# Patient Record
Sex: Female | Born: 1995 | Race: White | Hispanic: No | Marital: Single | Smoking: Never smoker
Health system: Southern US, Community
[De-identification: ages and names within clinical notes are randomized; demographics above are authoritative.]

## PROBLEM LIST (undated history)

## (undated) DIAGNOSIS — R011 Cardiac murmur, unspecified: Secondary | ICD-10-CM

## (undated) HISTORY — PX: OTHER SURGICAL HISTORY: SHX169

---
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
## 2019-12-31 ENCOUNTER — Encounter: Payer: Self-pay | Admitting: Emergency Medicine

## 2019-12-31 ENCOUNTER — Other Ambulatory Visit: Payer: Self-pay

## 2019-12-31 ENCOUNTER — Ambulatory Visit
Admission: EM | Admit: 2019-12-31 | Discharge: 2019-12-31 | Disposition: A | Discharge status: Home / Self Care | Payer: BC Managed Care – PPO | Attending: Emergency Medicine | Admitting: Emergency Medicine

## 2019-12-31 DIAGNOSIS — Z20822 Contact with and (suspected) exposure to covid-19: Secondary | ICD-10-CM | POA: Diagnosis present

## 2019-12-31 DIAGNOSIS — J069 Acute upper respiratory infection, unspecified: Secondary | ICD-10-CM | POA: Diagnosis not present

## 2019-12-31 DIAGNOSIS — J029 Acute pharyngitis, unspecified: Secondary | ICD-10-CM | POA: Diagnosis present

## 2019-12-31 LAB — POCT RAPID STREP A (OFFICE): Rapid Strep A Screen: NEGATIVE

## 2019-12-31 NOTE — Discharge Instructions (Addendum)
COVID testing ordered.  It will take between 2-5 days for test results.  Someone will contact you regarding abnormal results.    In the meantime: You should remain isolated in your home for 10 days from symptom onset AND greater than 72 hours after symptoms resolution (absence of fever without the use of fever-reducing medication and improvement in respiratory symptoms), whichever is longer Get plenty of rest and push fluids Use OTC zyrtec for nasal congestion, runny nose, and/or sore throat Use OTC flonase for nasal congestion and runny nose Use medications daily for symptom relief Use OTC medications like ibuprofen or tylenol as needed fever or pain Call or go to the ED if you have any new or worsening symptoms such as fever, cough, shortness of breath, chest tightness, chest pain, turning blue, changes in mental status, etc...  

## 2019-12-31 NOTE — ED Triage Notes (Signed)
Saturday, patient noticed her throat was really sore with swallowing.  Patient has stuffy head, but was around cats yesterday (usually causes head stuffiness)  Patient received a message from a parent that a child in class has strep throat, this is her concern

## 2019-12-31 NOTE — ED Provider Notes (Signed)
[facility]   [id number] 12/31/19 Arrival Time: 0806   CC: COVID symptoms  SUBJECTIVE: History from: patient.  Elizabeth Nguyen is a 24 y.o. female who presents with congestion, slight runny nose, and sore throat x 2 days.  Admits to positive strep exposure.  Denies recent travel.  Has tried OTC nyquil with minimal relief.  Symptoms are made worse with swallowing, but tolerating liquids and secretions.  Reports previous symptoms in the past with strep.   Denies fever, chills, fatigue, cough, SOB, wheezing, chest pain, nausea, vomiting, changes in bowel or bladder habits.    ROS: As per HPI.  All other pertinent ROS negative.     Past Medical History:  Diagnosis Date  . Asthma   . Rosanna Randy syndrome    Past Surgical History:  Procedure Laterality Date  . Lt femor    . Repaired ligaments rt wrist     Allergies  Allergen Reactions  . Amoxicillin Hives   No current facility-administered medications on file prior to encounter.   Current Outpatient Medications on File Prior to Encounter  Medication Sig Dispense Refill  . albuterol (PROVENTIL HFA;VENTOLIN HFA) 108 (90 BASE) MCG/ACT inhaler Inhale 2 puffs into the lungs every 6 (six) hours as needed for wheezing or shortness of breath.     Social History   Socioeconomic History  . Marital status: Single    Spouse name: Not on file  . Number of children: Not on file  . Years of education: Not on file  . Highest education level: Not on file  Occupational History  . Not on file  Tobacco Use  . Smoking status: Never Smoker  Substance and Sexual Activity  . Alcohol use: No  . Drug use: No  . Sexual activity: Never    Birth control/protection: Abstinence  Other Topics Concern  . Not on file  Social History Narrative  . Not on file   Social Determinants of Health   Financial Resource Strain:   . Difficulty of Paying Living Expenses:   Food Insecurity:   . Worried About Charity fundraiser in the Last Year:   .  Arboriculturist in the Last Year:   Transportation Needs:   . Film/video editor (Medical):   Marland Kitchen Lack of Transportation (Non-Medical):   Physical Activity:   . Days of Exercise per Week:   . Minutes of Exercise per Session:   Stress:   . Feeling of Stress :   Social Connections:   . Frequency of Communication with Friends and Family:   . Frequency of Social Gatherings with Friends and Family:   . Attends Religious Services:   . Active Member of Clubs or Organizations:   . Attends Archivist Meetings:   Marland Kitchen Marital Status:   Intimate Partner Violence:   . Fear of Current or Ex-Partner:   . Emotionally Abused:   Marland Kitchen Physically Abused:   . Sexually Abused:    Family History  Problem Relation Age of Onset  . Asthma Father     OBJECTIVE:  Vitals:   12/31/19 0815  BP: 112/74  Pulse: 87  Resp: 16  Temp: 98.1 F (36.7 C)  TempSrc: Oral  SpO2: 98%     General appearance: alert; appears mildly fatigued, but nontoxic; speaking in full sentences and tolerating own secretions HEENT: NCAT; Ears: EACs clear, TMs pearly gray; Eyes: PERRL.  EOM grossly intact.  Nose: nares patent without rhinorrhea, Throat: oropharynx clear, tonsils non erythematous or enlarged,  uvula midline  Neck: supple without LAD Lungs: unlabored respirations, symmetrical air entry; cough: absent; no respiratory distress; CTAB Heart: regular rate and rhythm.   Skin: warm and dry Psychological: alert and cooperative; normal mood and affect  LABS:  Results for orders placed or performed during the hospital encounter of 12/31/19 (from the past 24 hour(s))  POCT rapid strep A     Status: None   Collection Time: 12/31/19  8:38 AM  Result Value Ref Range   Rapid Strep A Screen Negative Negative    ASSESSMENT & PLAN:  1. Viral URI   2. Sore throat   3. Suspected COVID-19 virus infection    Strep negative.  Culture sent, we will call you with abnormal results COVID testing ordered.  It will take  between 2-5 days for test results.  Someone will contact you regarding abnormal results.    In the meantime: You should remain isolated in your home for 10 days from symptom onset AND greater than 72 hours after symptoms resolution (absence of fever without the use of fever-reducing medication and improvement in respiratory symptoms), whichever is longer Get plenty of rest and push fluids Use OTC zyrtec for nasal congestion, runny nose, and/or sore throat Use OTC flonase for nasal congestion and runny nose Use medications daily for symptom relief Use OTC medications like ibuprofen or tylenol as needed fever or pain Call or go to the ED if you have any new or worsening symptoms such as fever, cough, shortness of breath, chest tightness, chest pain, turning blue, changes in mental status, etc...   Reviewed expectations re: course of current medical issues. Questions answered. Outlined signs and symptoms indicating need for more acute intervention. Patient verbalized understanding. After Visit Summary given.         Alvino [facility] [place], PA-C 12/31/19 [phone]

## 2020-01-01 LAB — NOVEL CORONAVIRUS, NAA: SARS-CoV-2, NAA: NOT DETECTED

## 2020-01-01 LAB — SARS-COV-2, NAA 2 DAY TAT

## 2020-01-03 LAB — CULTURE, GROUP A STREP (THRC)

## 2020-06-02 ENCOUNTER — Other Ambulatory Visit: Payer: Self-pay

## 2020-06-02 ENCOUNTER — Ambulatory Visit
Admission: EM | Admit: 2020-06-02 | Discharge: 2020-06-02 | Disposition: A | Discharge status: Home / Self Care | Payer: BC Managed Care – PPO | Attending: Emergency Medicine | Admitting: Emergency Medicine

## 2020-06-02 DIAGNOSIS — J069 Acute upper respiratory infection, unspecified: Secondary | ICD-10-CM | POA: Diagnosis not present

## 2020-06-02 DIAGNOSIS — Z20822 Contact with and (suspected) exposure to covid-19: Secondary | ICD-10-CM

## 2020-06-02 DIAGNOSIS — Z1152 Encounter for screening for COVID-19: Secondary | ICD-10-CM

## 2020-06-02 MED ORDER — BENZONATATE 100 MG PO CAPS: 100.0000 mg | ORAL_CAPSULE | Freq: Three times a day (TID) | ORAL | 0 refills | Status: DC

## 2020-06-02 NOTE — ED Triage Notes (Signed)
Pt presents with nasal congestion and cough that began on Saturday  °

## 2020-06-02 NOTE — ED Provider Notes (Signed)
[facility] CARE CENTER   [id number] 06/02/20 Arrival Time: 0806   CC: COVID symptoms  SUBJECTIVE: History from: patient.  Elizabeth Nguyen is a 24 y.o. female who presents with nasal congestion and cough x 2 days.  Denies known sick exposure to COVID, flu or strep.  Works as Engineer, site.  Has tried OTC medications without relief.  Symptoms are made worse with talking.  Reports previous symptoms in the past with a cold.  Denies previous COVID infection.  Did not received covid vaccine.   Denies fever, chills, sore throat, SOB, wheezing, chest pain, nausea, changes in bowel or bladder habits.     ROS: As per HPI.  All other pertinent ROS negative.     Past Medical History:  Diagnosis Date   Asthma    Gilbert syndrome    Past Surgical History:  Procedure Laterality Date   Lt femor     Repaired ligaments rt wrist     Allergies  Allergen Reactions   Amoxicillin Hives   No current facility-administered medications on file prior to encounter.   Current Outpatient Medications on File Prior to Encounter  Medication Sig Dispense Refill   albuterol (PROVENTIL HFA;VENTOLIN HFA) 108 (90 BASE) MCG/ACT inhaler Inhale 2 puffs into the lungs every 6 (six) hours as needed for wheezing or shortness of breath.     Social History   Socioeconomic History   Marital status: Single    Spouse name: Not on file   Number of children: Not on file   Years of education: Not on file   Highest education level: Not on file  Occupational History   Not on file  Tobacco Use   Smoking status: Never Smoker  Substance and Sexual Activity   Alcohol use: No   Drug use: No   Sexual activity: Never    Birth control/protection: Abstinence  Other Topics Concern   Not on file  Social History Narrative   Not on file   Social Determinants of Health   Financial Resource Strain:    Difficulty of Paying Living Expenses: Not on file  Food Insecurity:    Worried About Running Out of Food  in the Last Year: Not on file   Ran Out of Food in the Last Year: Not on file  Transportation Needs:    Lack of Transportation (Medical): Not on file   Lack of Transportation (Non-Medical): Not on file  Physical Activity:    Days of Exercise per Week: Not on file   Minutes of Exercise per Session: Not on file  Stress:    Feeling of Stress : Not on file  Social Connections:    Frequency of Communication with Friends and Family: Not on file   Frequency of Social Gatherings with Friends and Family: Not on file   Attends Religious Services: Not on file   Active Member of Clubs or Organizations: Not on file   Attends Banker Meetings: Not on file   Marital Status: Not on file  Intimate Partner Violence:    Fear of Current or Ex-Partner: Not on file   Emotionally Abused: Not on file   Physically Abused: Not on file   Sexually Abused: Not on file   Family History  Problem Relation Age of Onset   Asthma Father     OBJECTIVE:  Vitals:   06/02/20 0816  BP: 112/70  Pulse: 78  Resp: 20  Temp: 98.3 F (36.8 C)  SpO2: 98%     General appearance: alert;  well-appearing, nontoxic; speaking in full sentences and tolerating own secretions HEENT: NCAT; Ears: EACs clear, TMs pearly gray; Eyes: PERRL.  EOM grossly intact.Nose: nares patent without rhinorrhea, Throat: oropharynx clear, tonsils non erythematous or enlarged, uvula midline  Neck: supple without LAD Lungs: unlabored respirations, symmetrical air entry; cough: absent; no respiratory distress; CTAB Heart: regular rate and rhythm.  Skin: warm and dry Psychological: alert and cooperative; normal mood and affect   ASSESSMENT & PLAN:  1. Encounter for screening for COVID-19   2. Viral URI with cough   3. Suspected COVID-19 virus infection     Meds ordered this encounter  Medications   benzonatate (TESSALON) 100 MG capsule    Sig: Take 1 capsule (100 mg total) by mouth every 8 (eight) hours.     Dispense:  21 capsule    Refill:  0    Order Specific Question:   Supervising Provider    Answer:   Eustace Moore [[id number]]   COVID testing ordered.  It will take between 5-7 days for test results.  Someone will contact you regarding abnormal results.    In the meantime: You should remain isolated in your home for 10 days from symptom onset AND greater than 72 hours after symptoms resolution (absence of fever without the use of fever-reducing medication and improvement in respiratory symptoms), whichever is longer Get plenty of rest and push fluids Tessalon Perles prescribed for cough Use OTC zyrtec for nasal congestion, runny nose, and/or sore throat Use OTC flonase for nasal congestion and runny nose Use medications daily for symptom relief Use OTC medications like ibuprofen or tylenol as needed fever or pain Call or go to the ED if you have any new or worsening symptoms such as fever, worsening cough, shortness of breath, chest tightness, chest pain, turning blue, changes in mental status, etc...   Reviewed expectations re: course of current medical issues. Questions answered. Outlined signs and symptoms indicating need for more acute intervention. Patient verbalized understanding. After Visit Summary given.         Rennis Harding, PA-C 06/02/20 [phone]

## 2020-06-02 NOTE — Discharge Instructions (Signed)

## 2020-06-03 LAB — SARS-COV-2, NAA 2 DAY TAT

## 2020-06-03 LAB — NOVEL CORONAVIRUS, NAA: SARS-CoV-2, NAA: NOT DETECTED

## 2020-10-01 ENCOUNTER — Institutional Professional Consult (permissible substitution): Payer: BC Managed Care – PPO | Admitting: Plastic Surgery

## 2020-10-19 ENCOUNTER — Encounter: Payer: Self-pay | Admitting: Emergency Medicine

## 2020-10-19 ENCOUNTER — Ambulatory Visit
Admission: EM | Admit: 2020-10-19 | Discharge: 2020-10-19 | Disposition: A | Discharge status: Home / Self Care | Payer: BC Managed Care – PPO | Attending: Emergency Medicine | Admitting: Emergency Medicine

## 2020-10-19 ENCOUNTER — Ambulatory Visit (INDEPENDENT_AMBULATORY_CARE_PROVIDER_SITE_OTHER): Payer: BC Managed Care – PPO

## 2020-10-19 ENCOUNTER — Other Ambulatory Visit: Payer: Self-pay

## 2020-10-19 DIAGNOSIS — M940 Chondrocostal junction syndrome [Tietze]: Secondary | ICD-10-CM

## 2020-10-19 DIAGNOSIS — J069 Acute upper respiratory infection, unspecified: Secondary | ICD-10-CM | POA: Diagnosis not present

## 2020-10-19 DIAGNOSIS — R0981 Nasal congestion: Secondary | ICD-10-CM

## 2020-10-19 DIAGNOSIS — R0789 Other chest pain: Secondary | ICD-10-CM | POA: Diagnosis not present

## 2020-10-19 MED ORDER — BENZONATATE 100 MG PO CAPS: 100.0000 mg | ORAL_CAPSULE | Freq: Three times a day (TID) | ORAL | 0 refills | Status: DC | PRN

## 2020-10-19 NOTE — ED Provider Notes (Signed)
[facility] CARE CENTER   [id number] 10/19/20 Arrival Time: 1779   CC: URI  SUBJECTIVE: History from: patient.  Elizabeth Nguyen is a 25 y.o. female who presented to the urgent care with a complaint of sinus congestion, chest tightness and pain with respiration for the past 1 week.  Was seen by PCP and was prescribed prednisone with no relief.  Denies sick exposure to COVID, flu or strep.  Denies recent travel.   Denies alleviating or aggravating factors.  Denies previous symptoms in the past.   Denies fever, chills, fatigue, sinus pain, rhinorrhea, sore throat, SOB, wheezing, nausea, changes in bowel or bladder habits.    ROS: As per HPI.  All other pertinent ROS negative.      Past Medical History:  Diagnosis Date  . Asthma   . Sullivan Lone syndrome    Past Surgical History:  Procedure Laterality Date  . Lt femor    . Repaired ligaments rt wrist     Allergies  Allergen Reactions  . Amoxicillin Hives   No current facility-administered medications on file prior to encounter.   Current Outpatient Medications on File Prior to Encounter  Medication Sig Dispense Refill  . albuterol (PROVENTIL HFA;VENTOLIN HFA) 108 (90 BASE) MCG/ACT inhaler Inhale 2 puffs into the lungs every 6 (six) hours as needed for wheezing or shortness of breath.     Social History   Socioeconomic History  . Marital status: Single    Spouse name: Not on file  . Number of children: Not on file  . Years of education: Not on file  . Highest education level: Not on file  Occupational History  . Not on file  Tobacco Use  . Smoking status: Never Smoker  . Smokeless tobacco: Never Used  Substance and Sexual Activity  . Alcohol use: No  . Drug use: No  . Sexual activity: Never    Birth control/protection: Abstinence  Other Topics Concern  . Not on file  Social History Narrative  . Not on file   Social Determinants of Health   Financial Resource Strain: Not on file  Food Insecurity: Not on file   Transportation Needs: Not on file  Physical Activity: Not on file  Stress: Not on file  Social Connections: Not on file  Intimate Partner Violence: Not on file   Family History  Problem Relation Age of Onset  . Asthma Father     OBJECTIVE:  Vitals:   10/19/20 0824  BP: 109/66  Pulse: 95  Resp: 16  Temp: 98.5 F (36.9 C)  TempSrc: Oral  SpO2: 98%     General appearance: alert; appears fatigued, but nontoxic; speaking in full sentences and tolerating own secretions HEENT: NCAT; Ears: EACs clear, TMs pearly gray; Eyes: PERRL.  EOM grossly intact. Sinuses: nontender; Nose: nares patent without rhinorrhea, Throat: oropharynx clear, tonsils non erythematous or enlarged, uvula midline  Neck: supple without LAD Lungs: unlabored respirations, symmetrical air entry; cough: mild; no respiratory distress; CTAB Heart: regular rate and rhythm.  Radial pulses 2+ symmetrical bilaterally Skin: warm and dry Psychological: alert and cooperative; normal mood and affect  LABS:  No results found for this or any previous visit (from the past 24 hour(s)).   RADIOLOGY  DG Chest 2 View  Result Date: 10/19/2020 CLINICAL DATA:  Chest pain and tightness with breathing. Sinus congestion. EXAM: CHEST - 2 VIEW COMPARISON:  Chest radiograph March 21, 2013 FINDINGS: The heart size and mediastinal contours are within normal limits. No focal consolidation. No pleural  effusion. No pneumothorax. Two small dense sclerotic lesions in the right humeral head, likely benign bone islands. IMPRESSION: No acute cardiopulmonary disease. Electronically Signed   By: Maudry Mayhew MD   On: 10/19/2020 08:43     Chest X-ray is negative for cardiopulmonary abnormality.  I have reviewed the x-ray myself and the radiologist interpretation.  I am in agreement with the radiologist interpretation.   ASSESSMENT & PLAN:  1. Costochondritis   2. Viral URI with cough     Meds ordered this encounter  Medications  .  benzonatate (TESSALON) 100 MG capsule    Sig: Take 1 capsule (100 mg total) by mouth 3 (three) times daily as needed for cough.    Dispense:  30 capsule    Refill:  0   Patient is stable at discharge.  Symptom is likely from costochondritis from coughing.  Will prescribed Tessalon Perles to help suppress her cough.  To continue to use OTC Tylenol as needed for pain  Discharge instructions  Get plenty of rest and push fluids Tessalon Perles prescribed for cough Continue to take prednisone as prescribed and directed Use OTC medications like ibuprofen or tylenol as needed fever or pain Follow-up with PCP Call or go to the ED if you have any new or worsening symptoms such as fever, worsening cough, shortness of breath, chest tightness, chest pain, turning blue, changes in mental status, etc...   Reviewed expectations re: course of current medical issues. Questions answered. Outlined signs and symptoms indicating need for more acute intervention. Patient verbalized understanding. After Visit Summary given.         Durward Parcel, FNP 10/19/20 [phone]

## 2020-10-19 NOTE — ED Triage Notes (Signed)
Pt reports pain in chest with breathing and tightness and sinus congestion.   Seen at Doctors office on Tuesday and put on prednisone.  S/s started x 1 week ago.

## 2020-10-19 NOTE — Discharge Instructions (Addendum)
Get plenty of rest and push fluids Tessalon Perles prescribed for cough Continue to take prednisone as prescribed and directed Use OTC medications like ibuprofen or tylenol as needed fever or pain Follow-up with PCP Call or go to the ED if you have any new or worsening symptoms such as fever, worsening cough, shortness of breath, chest tightness, chest pain, turning blue, changes in mental status, etc..Marland Kitchen

## 2020-10-29 ENCOUNTER — Ambulatory Visit (INDEPENDENT_AMBULATORY_CARE_PROVIDER_SITE_OTHER): Payer: BC Managed Care – PPO | Admitting: Plastic Surgery

## 2020-10-29 ENCOUNTER — Encounter: Payer: Self-pay | Admitting: Plastic Surgery

## 2020-10-29 ENCOUNTER — Other Ambulatory Visit: Payer: Self-pay

## 2020-10-29 VITALS — BP 98/58 | HR 78 | Ht 65.0 in | Wt 152.4 lb

## 2020-10-29 DIAGNOSIS — L989 Disorder of the skin and subcutaneous tissue, unspecified: Secondary | ICD-10-CM

## 2020-10-29 NOTE — Progress Notes (Signed)
   Referring Provider Practice, [facility] [address] [place],  [place] [address]   CC:  Chief Complaint  Patient presents with  . Advice Only      Elizabeth Nguyen is an 25 y.o. female.  HPI: Patient presents to discuss a symptomatic skin lesion in her right alar crease.  Is been present for a number of years and she believes it is growing in size.  It catches on things intermittently and she is interested in having it removed.  Allergies  Allergen Reactions  . Amoxicillin Hives    Outpatient Encounter Medications as of 10/29/2020  Medication Sig  . albuterol (PROVENTIL HFA;VENTOLIN HFA) 108 (90 BASE) MCG/ACT inhaler Inhale 2 puffs into the lungs every 6 (six) hours as needed for wheezing or shortness of breath.  . benzonatate (TESSALON) 100 MG capsule Take 1 capsule (100 mg total) by mouth 3 (three) times daily as needed for cough.   No facility-administered encounter medications on file as of 10/29/2020.     Past Medical History:  Diagnosis Date  . Asthma   . Sullivan Lone syndrome     Past Surgical History:  Procedure Laterality Date  . Lt femor    . Repaired ligaments rt wrist      Family History  Problem Relation Age of Onset  . Asthma Father     Social History   Social History Narrative  . Not on file     Review of Systems General: Denies fevers, chills, weight loss CV: Denies chest pain, shortness of breath, palpitations  Physical Exam Vitals with BMI 10/29/2020 10/19/2020 06/02/2020  Height 5\' 5"  - -  Weight 152 lbs 6 oz - -  BMI 25.36 - -  Systolic 98 109 112  Diastolic 58 66 70  Pulse 78 95 78    General:  No acute distress,  Alert and oriented, Non-Toxic, Normal speech and affect Examination shows a 3 mm flesh colored papule on the right alar crease.  There is no surrounding skin changes and the lesion itself is fairly well-defined.  Assessment/Plan Patient presents with a symptomatic skin lesion in the right alar crease.  We discussed excision under  local.  We discussed the risk that include bleeding, infection, damage to surrounding structures and need for additional procedures.  All of her questions were answered we will plan to move forward with excision.  I did discuss the location and orientation of the scar and feel that it will heal nicely in the alar crease.  10/29/2020, 3:29 PM

## 2020-12-04 ENCOUNTER — Ambulatory Visit (INDEPENDENT_AMBULATORY_CARE_PROVIDER_SITE_OTHER): Payer: BC Managed Care – PPO | Admitting: Plastic Surgery

## 2020-12-04 ENCOUNTER — Encounter: Payer: Self-pay | Admitting: Plastic Surgery

## 2020-12-04 ENCOUNTER — Other Ambulatory Visit (HOSPITAL_COMMUNITY)
Admission: RE | Admit: 2020-12-04 | Discharge: 2020-12-04 | Disposition: A | Discharge status: Home / Self Care | Payer: BC Managed Care – PPO | Source: Ambulatory Visit | Attending: Plastic Surgery | Admitting: Plastic Surgery

## 2020-12-04 ENCOUNTER — Other Ambulatory Visit: Payer: Self-pay

## 2020-12-04 VITALS — BP 133/81 | HR 91

## 2020-12-04 DIAGNOSIS — L989 Disorder of the skin and subcutaneous tissue, unspecified: Secondary | ICD-10-CM

## 2020-12-04 NOTE — Progress Notes (Signed)
Operative Note   DATE OF OPERATION: 12/04/2020  LOCATION:    SURGICAL DEPARTMENT: Plastic Surgery  PREOPERATIVE DIAGNOSES: Right nasal lesion  POSTOPERATIVE DIAGNOSES:  same  PROCEDURE:  1. Excision of right nasal lesion measuring 1.5 cm 2. Complex closure measuring 1.5 cm  SURGEON: Ancil Linsey, MD  ANESTHESIA:  Local  COMPLICATIONS: None.   INDICATIONS FOR PROCEDURE:  The patient, Elizabeth Nguyen is a 25 y.o. female born on 13-Sep-1995, is here for treatment of right nasal lesion MRN: [id number]  CONSENT:  Informed consent was obtained directly from the patient. Risks, benefits and alternatives were fully discussed. Specific risks including but not limited to bleeding, infection, hematoma, seroma, scarring, pain, infection, wound healing problems, and need for further surgery were all discussed. The patient did have an ample opportunity to have questions answered to satisfaction.   DESCRIPTION OF PROCEDURE:  Local anesthesia was administered. The patient's operative site was prepped and draped in a sterile fashion. A time out was performed and all information was confirmed to be correct.  The lesion was excised with a 15 blade.  Hemostasis was obtained.  Circumferential undermining was performed and the skin was advanced and closed in layers with interrupted buried Monocryl sutures and 5-0 fast gut for the skin.  The lesion excised measured 1.5 cm, and the total length of closure measured 1.5 cm.    The patient tolerated the procedure well.  There were no complications.

## 2020-12-08 LAB — SURGICAL PATHOLOGY

## 2020-12-09 ENCOUNTER — Telehealth: Payer: Self-pay

## 2020-12-09 NOTE — Telephone Encounter (Signed)
Patient called to say that she saw [place] last Thursday for a mole removal and on Saturday she noticed that she was swelling under her eyes.  Patient said that on Sunday, her stitches started coming out and she is concerned because we had told her that they would come out roughly in 7-10 days.  Patient said that her swelling has gone down significantly.  Please call.

## 2020-12-10 NOTE — Telephone Encounter (Signed)
Called and spoke with the patient on (12/09/20) regarding the message below.  Informed the patient that I spoke with [facility] and he stated that the swelling is normal, and with the sutures some people absorb the sutures quicker than others.  Patient verbalized understanding and agreed.  Patient also asked if she needed to put anything on the area to protect it.  Informed her per [facility] do not use sunscreen on it because it's to soon.  But she can use vaseline on it.  Patient agreed.  Also informed the patient of recent surgical pathology results.  Per Matthew,PA-C the results shows it was just a mole.  Patient verbalized understanding and agreed.//AB/CMA

## 2020-12-12 ENCOUNTER — Telehealth: Payer: Self-pay

## 2020-12-12 NOTE — Telephone Encounter (Signed)
Patient called to say that she came in last Thursday for a mole removal and her stitches started coming out on Sunday from the deepest part of the incision.  Patient said that it scabbed over.  She said that the scab came off yesterday and it doesn't look like the two pieces of skin are together.  Patient said that it is not bleeding but she is wondering if she needs to be seen today.  Please call.

## 2020-12-12 NOTE — Telephone Encounter (Signed)
Returned patients call. She indicated it appeared some of the stitches in the deepest part of incision came out to early. It is currently not bleeding. Under the advise from [facility], apply Vaseline and cover with gauze. I will have the front staff call her back to come back in Monday.

## 2020-12-15 ENCOUNTER — Encounter: Payer: Self-pay | Admitting: Plastic Surgery

## 2020-12-15 ENCOUNTER — Other Ambulatory Visit: Payer: Self-pay

## 2020-12-15 ENCOUNTER — Ambulatory Visit (INDEPENDENT_AMBULATORY_CARE_PROVIDER_SITE_OTHER): Payer: BC Managed Care – PPO | Admitting: Plastic Surgery

## 2020-12-15 VITALS — BP 90/63 | HR 74

## 2020-12-15 DIAGNOSIS — L989 Disorder of the skin and subcutaneous tissue, unspecified: Secondary | ICD-10-CM

## 2020-12-15 NOTE — Progress Notes (Signed)
Patient presents postop from excision of an intradermal nevus in her right alar crease.  She had concerns about some of the stitches falling out having some swelling under her right eye over the weekend that has since subsided for the most part.  On exam the incision the incision looks to be healing fine with minimal surrounding erythema.  There may be some very subtle swelling of her right cheek but not anything significant.  I think overall she is healing well for being just over a week out from this procedure.  We discussed management of the scar and sun protection and we will plan to see her again on an as-needed basis.  All of her questions were answered.

## 2021-01-16 ENCOUNTER — Ambulatory Visit
Admission: EM | Admit: 2021-01-16 | Discharge: 2021-01-16 | Disposition: A | Discharge status: Home / Self Care | Payer: BC Managed Care – PPO | Attending: Emergency Medicine | Admitting: Emergency Medicine

## 2021-01-16 ENCOUNTER — Other Ambulatory Visit: Payer: Self-pay

## 2021-01-16 ENCOUNTER — Encounter: Payer: Self-pay | Admitting: Emergency Medicine

## 2021-01-16 DIAGNOSIS — H8110 Benign paroxysmal vertigo, unspecified ear: Secondary | ICD-10-CM

## 2021-01-16 DIAGNOSIS — R0789 Other chest pain: Secondary | ICD-10-CM

## 2021-01-16 DIAGNOSIS — R42 Dizziness and giddiness: Secondary | ICD-10-CM | POA: Diagnosis not present

## 2021-01-16 MED ORDER — HYDROXYZINE HCL 25 MG PO TABS: 25.0000 mg | ORAL_TABLET | Freq: Four times a day (QID) | ORAL | 0 refills | Status: DC

## 2021-01-16 MED ORDER — MECLIZINE HCL 50 MG PO TABS: 50.0000 mg | ORAL_TABLET | Freq: Three times a day (TID) | ORAL | 0 refills | Status: AC | PRN

## 2021-01-16 NOTE — ED Provider Notes (Addendum)
[facility] CARE CENTER   [id number] 01/16/21 Arrival Time: 1715   CC: CHEST tightness/ dizziness  SUBJECTIVE:  Elizabeth Nguyen is a 25 y.o. female who presents with complaint of chest tightness (feels like something needs to pop) and intermittent dizziness (like room is tilted/ feeling off balanced) x 4-5 days.  Reports having panic attacks more frequently.  Localizes chest pain to the center of chest.  Describes as stable, that is intermittent (with episodes last 30 minutes).  Denies pain at this time.   Has NOT tried OTC medications without relief.  Symptoms made worse with sudden movement.  Denies radiates symptoms.  Denies previous symptoms in the past.  Denies fever, chills, palpitations, tachycardia, SOB, nausea, vomiting, abdominal pain, changes in bowel or bladder habits, diaphoresis, numbness/tingling in extremities, peripheral edema, or anxiety.    Denies SOB, calf pain or swelling, recent long travel, recent surgery, pregnancy, malignancy, tobacco use, hormone use, or previous blood clot  Denies close relatives with cardiac hx  ROS: As per HPI.  All other pertinent ROS negative.    Past Medical History:  Diagnosis Date  . Asthma   . Sullivan Lone syndrome    Past Surgical History:  Procedure Laterality Date  . Lt femor    . Repaired ligaments rt wrist     Allergies  Allergen Reactions  . Amoxicillin Hives   No current facility-administered medications on file prior to encounter.   Current Outpatient Medications on File Prior to Encounter  Medication Sig Dispense Refill  . [DISCONTINUED] albuterol (PROVENTIL HFA;VENTOLIN HFA) 108 (90 BASE) MCG/ACT inhaler Inhale 2 puffs into the lungs every 6 (six) hours as needed for wheezing or shortness of breath. (Patient not taking: No sig reported)     Social History   Socioeconomic History  . Marital status: Single    Spouse name: Not on file  . Number of children: Not on file  . Years of education: Not on file  . Highest  education level: Not on file  Occupational History  . Not on file  Tobacco Use  . Smoking status: Never Smoker  . Smokeless tobacco: Never Used  Substance and Sexual Activity  . Alcohol use: No  . Drug use: No  . Sexual activity: Never    Birth control/protection: Abstinence  Other Topics Concern  . Not on file  Social History Narrative  . Not on file   Social Determinants of Health   Financial Resource Strain: Not on file  Food Insecurity: Not on file  Transportation Needs: Not on file  Physical Activity: Not on file  Stress: Not on file  Social Connections: Not on file  Intimate Partner Violence: Not on file   Family History  Problem Relation Age of Onset  . Asthma Father      OBJECTIVE:  Vitals:   01/16/21 1806  BP: 114/77  Pulse: 66  Resp: 17  Temp: 97.9 F (36.6 C)  TempSrc: Oral  SpO2: 99%    General appearance: alert; no distress Eyes: PERRLA; EOMI; conjunctiva normal HENT: normocephalic; atraumatic Neck: supple Lungs: clear to auscultation bilaterally without adventitious breath sounds Heart: regular rate and rhythm.  Clear S1 and S2 without rubs, gallops, or murmur. Abdomen: soft, non-tender; bowel sounds normal; no guarding Extremities: no cyanosis or edema; symmetrical with no gross deformities Skin: warm and dry Neuro: CN 2-12 grossly intact; finger to nose without deficit; negative pronator drift Psychological: alert and cooperative; normal mood and affect  ASSESSMENT & PLAN:  1. Dizziness  2. Benign paroxysmal positional vertigo, unspecified laterality   3. Feeling of chest tightness     Meds ordered this encounter  Medications  . hydrOXYzine (ATARAX/VISTARIL) 25 MG tablet    Sig: Take 1 tablet (25 mg total) by mouth every 6 (six) hours.    Dispense:  12 tablet    Refill:  0    Order Specific Question:   Supervising Provider    Answer:   Eustace Moore [[id number]]  . meclizine (ANTIVERT) 50 MG tablet    Sig: Take 1 tablet (50 mg  total) by mouth 3 (three) times daily as needed.    Dispense:  30 tablet    Refill:  0    Order Specific Question:   Supervising Provider    Answer:   Eustace Moore [[id number]]   Unable to rule out cardiac disease or blood clot in urgent care setting.  Offered patient further evaluation and management in the ED.  Patient declines at this time and would like to try outpatient therapy first.  Aware of the risk associated with this decision including missed diagnosis, organ damage, organ failure, and/or death.  Patient aware and in agreement.     Meclizine prescribed for vertigo.  Take as directed for symptomatic relief.  This medication may make you drowsy so use with cautions while driving or operating heavy machinery.  Rest and drink fluids Eat a well-balanced diet, and avoid excessive caffeine intake Hydroxyzine nightly for symptom relief Some things you may try doing to help alleviate your symptoms include: keeping a journal, exercise, talking to a friend or relative, listening to music, going for a walk or hike outside, or other activities that you may find enjoyable Follow up with PCP Return or go to ER if you have any new or worsening symptoms such as fever, chills, fatigue, worsening shortness of breath, wheezing, chest pain, nausea, vomiting, abdominal pain, changes in bowel or bladder habits, etc...   Chest pain precautions given. Reviewed expectations re: course of current medical issues. Questions answered. Outlined signs and symptoms indicating need for more acute intervention. Patient verbalized understanding. After Visit Summary given.   Rennis Harding, PA-C 01/16/21 1845    Rennis Harding, PA-C 01/16/21 1845

## 2021-01-16 NOTE — ED Triage Notes (Addendum)
Intermittent dizziness and chest tightness (states she feels like her chest needs to pop) that started earlier this week.  Was seen at a Doctor in [place] this past Friday for the same s/s.  had blood work and ekg which was normal.  Prescribed lorazepam for panic attacks,  but states she has taken it because she didn't feel like she needed it.

## 2021-01-16 NOTE — Discharge Instructions (Signed)
Meclizine prescribed for vertigo.  Take as directed for symptomatic relief.  This medication may make you drowsy so use with cautions while driving or operating heavy machinery.  Rest and drink fluids Eat a well-balanced diet, and avoid excessive caffeine intake Hydroxyzine nightly for symptom relief Some things you may try doing to help alleviate your symptoms include: keeping a journal, exercise, talking to a friend or relative, listening to music, going for a walk or hike outside, or other activities that you may find enjoyable Follow up with PCP Return or go to ER if you have any new or worsening symptoms such as fever, chills, fatigue, worsening shortness of breath, wheezing, chest pain, nausea, vomiting, abdominal pain, changes in bowel or bladder habits, etc..Marland Kitchen

## 2021-04-17 ENCOUNTER — Ambulatory Visit: Payer: Self-pay | Admitting: Allergy & Immunology

## 2021-04-29 ENCOUNTER — Ambulatory Visit
Admission: EM | Admit: 2021-04-29 | Discharge: 2021-04-29 | Disposition: A | Discharge status: Home / Self Care | Payer: BC Managed Care – PPO | Attending: Family Medicine | Admitting: Family Medicine

## 2021-04-29 ENCOUNTER — Encounter: Payer: Self-pay | Admitting: Emergency Medicine

## 2021-04-29 ENCOUNTER — Other Ambulatory Visit: Payer: Self-pay

## 2021-04-29 DIAGNOSIS — H9202 Otalgia, left ear: Secondary | ICD-10-CM

## 2021-04-29 DIAGNOSIS — H8110 Benign paroxysmal vertigo, unspecified ear: Secondary | ICD-10-CM

## 2021-04-29 MED ORDER — PREDNISONE 20 MG PO TABS: 40.0000 mg | ORAL_TABLET | Freq: Every day | ORAL | 0 refills | Status: DC

## 2021-04-29 NOTE — ED Provider Notes (Signed)
  [facility]   [id number] 04/29/21 Arrival Time: 1105  ASSESSMENT & PLAN:  1. Acute otalgia, left   2. Benign paroxysmal positional vertigo, unspecified laterality     Normal neurologic exam. Continue meclizine. No indication for neurodiagnostic imaging at this time. Discussed. Begin trial of: Meds ordered this encounter  Medications   predniSONE (DELTASONE) 20 MG tablet    Sig: Take 2 tablets (40 mg total) by mouth daily.    Dispense:  10 tablet    Refill:  0    Follow-up Information     Practice, [facility].   Why: If worsening or failing to improve as anticipated. Contact information: [address] [place] [address] [phone]                  Reviewed expectations re: course of current medical issues. Questions answered. Outlined signs and symptoms indicating need for more acute intervention. Patient verbalized understanding. After Visit Summary given.   SUBJECTIVE:  Elizabeth Nguyen is a 25 y.o. female who reports L otalgia with associated positional vertigo; abrupt onset yesterday; same today. Afebrile. Mild URI symptoms. Meclizine without much relief; h/o vertigo; meclizine usu helps. No HA or visual changes.  Social History   Substance and Sexual Activity  Alcohol Use No   Social History   Tobacco Use  Smoking Status Never  Smokeless Tobacco Never    OBJECTIVE:  Vitals:   04/29/21 1119  BP: 111/68  Pulse: (!) 58  Resp: 18  Temp: 98.3 F (36.8 C)  TempSrc: Oral  SpO2: 99%    General appearance: alert; no distress Eyes: PERRLA; EOMI; conjunctiva normal HENT: normocephalic; atraumatic; clear fluid behind L TM; nasal mucosa normal; oral mucosa normal Neck: supple with FROM Skin: warm and dry Neurologic: normal gait;CN 2-12 grossly intact Psychological: alert and cooperative; normal mood and affect   Allergies  Allergen Reactions   Amoxicillin Hives   Hydrocodone Anxiety    Hallucinations     Past Medical  History:  Diagnosis Date   Asthma    Gilbert syndrome    Social History   Socioeconomic History   Marital status: Single    Spouse name: Not on file   Number of children: Not on file   Years of education: Not on file   Highest education level: Not on file  Occupational History   Not on file  Tobacco Use   Smoking status: Never   Smokeless tobacco: Never  Substance and Sexual Activity   Alcohol use: No   Drug use: No   Sexual activity: Never    Birth control/protection: Abstinence  Other Topics Concern   Not on file  Social History Narrative   Not on file   Social Determinants of Health   Financial Resource Strain: Not on file  Food Insecurity: Not on file  Transportation Needs: Not on file  Physical Activity: Not on file  Stress: Not on file  Social Connections: Not on file  Intimate Partner Violence: Not on file   Family History  Problem Relation Age of Onset   Asthma Father    Past Surgical History:  Procedure Laterality Date   Lt femor     Repaired ligaments rt wrist         Mardella Layman, MD 04/29/21 1146

## 2021-04-29 NOTE — ED Triage Notes (Signed)
LT ear pain that started today.  Reports having dizziness as well unrelieved by meclizine.

## 2021-05-25 ENCOUNTER — Ambulatory Visit: Payer: BC Managed Care – PPO | Admitting: Gastroenterology

## 2021-09-01 ENCOUNTER — Telehealth: Payer: BC Managed Care – PPO | Admitting: Nurse Practitioner

## 2021-09-01 DIAGNOSIS — L503 Dermatographic urticaria: Secondary | ICD-10-CM | POA: Insufficient documentation

## 2021-09-01 DIAGNOSIS — J069 Acute upper respiratory infection, unspecified: Secondary | ICD-10-CM

## 2021-09-01 DIAGNOSIS — L700 Acne vulgaris: Secondary | ICD-10-CM | POA: Insufficient documentation

## 2021-09-01 NOTE — Progress Notes (Signed)

## 2021-09-06 ENCOUNTER — Other Ambulatory Visit: Payer: Self-pay

## 2021-09-06 ENCOUNTER — Ambulatory Visit
Admission: EM | Admit: 2021-09-06 | Discharge: 2021-09-06 | Disposition: A | Discharge status: Home / Self Care | Payer: BC Managed Care – PPO | Attending: Family Medicine | Admitting: Family Medicine

## 2021-09-06 DIAGNOSIS — U071 COVID-19: Secondary | ICD-10-CM | POA: Diagnosis not present

## 2021-09-06 DIAGNOSIS — R051 Acute cough: Secondary | ICD-10-CM

## 2021-09-06 MED ORDER — PROMETHAZINE-DM 6.25-15 MG/5ML PO SYRP: 5.0000 mL | ORAL_SOLUTION | Freq: Four times a day (QID) | ORAL | 0 refills | Status: DC | PRN

## 2021-09-06 MED ORDER — PREDNISONE 20 MG PO TABS: 40.0000 mg | ORAL_TABLET | Freq: Every day | ORAL | 0 refills | Status: DC

## 2021-09-06 NOTE — ED Provider Notes (Signed)
[facility]    CSN: [id number] Arrival date & time: 09/06/21  0802      History   Chief Complaint Chief Complaint  Patient presents with   Cough    Cough, congestion and runny nose    HPI Elizabeth Nguyen is a 26 y.o. female.   Patient presenting today with a day history of waxing and waning symptoms to include a dry cough that is nonproductive, initially fever body aches and fatigue that have now resolved, nasal congestion, sore throat.  States that for several days she felt quite a bit better but the cough has become deeper and more productive.  Tried some Mucinex once but states that made her symptoms worse so she stopped and tried DayQuil which also did not help.  She does have a history of exercise-induced asthma on albuterol as needed states that she has not tried her inhaler since onset of symptoms.  She tested positive for COVID mid last week.   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      Home Medications    Prior to Admission medications   Medication Sig Start Date End Date Taking? Authorizing Provider  promethazine-dextromethorphan (PROMETHAZINE-DM) 6.25-15 MG/5ML syrup Take 5 mLs by mouth 4 (four) times daily as needed. 09/06/21  Yes Particia NearingLane, Kayley Zeiders Elizabeth, PA-C  hydrOXYzine (ATARAX/VISTARIL) 25 MG tablet Take 1 tablet (25 mg total) by mouth every 6 (six) hours. 01/16/21   Wurst, [place], PA-C  LORazepam (ATIVAN) 0.5 MG tablet Take 0.5 mg by mouth daily. 01/09/21   [provider]  meclizine (ANTIVERT) 50 MG tablet Take 1 tablet (50 mg total) by mouth 3 (three) times daily as needed. 01/16/21   Wurst, [place], PA-C  predniSONE (DELTASONE) 20 MG tablet Take 2  tablets (40 mg total) by mouth daily. 09/06/21   Particia NearingLane, Shaheen Mende Elizabeth, PA-C  albuterol (PROVENTIL HFA;VENTOLIN HFA) 108 (90 BASE) MCG/ACT inhaler Inhale 2 puffs into the lungs every 6 (six) hours as needed for wheezing or shortness of breath. Patient not taking: No sig reported  01/16/21  [provider]    Family History Family History  Problem Relation Age of Onset   Asthma Father     Social History Social History   Tobacco Use   Smoking status: Never   Smokeless tobacco: Never  Vaping Use   Vaping Use: Never used  Substance Use Topics   Alcohol use: Yes    Comment: Occas   Drug use: No     Allergies   Amoxicillin and Hydrocodone   Review of Systems Review of Systems Per HPI  Physical Exam Triage Vital Signs ED Triage Vitals  Enc Vitals Group     BP 09/06/21 0813 109/72     Pulse Rate 09/06/21 0813 62     Resp 09/06/21 0813 16     Temp 09/06/21 0813 98.4 F (36.9 C)     Temp Source 09/06/21 0813 Oral     SpO2 09/06/21 0813 100 %     Weight --      Height --      Head Circumference --  Peak Flow --      Pain Score 09/06/21 0811 0     Pain Loc --      Pain Edu? --      Excl. in GC? --    No data found.  Updated Vital Signs BP 109/72 (BP Location: Right Arm)    Pulse 62    Temp 98.4 F (36.9 C) (Oral)    Resp 16    LMP 08/16/2021 (Exact Date)    SpO2 100%   Visual Acuity Right Eye Distance:   Left Eye Distance:   Bilateral Distance:    Right Eye Near:   Left Eye Near:    Bilateral Near:     Physical Exam Vitals and nursing note reviewed.  Constitutional:      Appearance: Normal appearance.  HENT:     Head: Atraumatic.     Right Ear: Tympanic membrane and external ear normal.     Left Ear: Tympanic membrane and external ear normal.     Nose: Rhinorrhea present.     Mouth/Throat:     Mouth: Mucous membranes are moist.     Pharynx: Posterior oropharyngeal erythema present. No oropharyngeal exudate.  Eyes:     Extraocular  Movements: Extraocular movements intact.     Conjunctiva/sclera: Conjunctivae normal.  Cardiovascular:     Rate and Rhythm: Normal rate and regular rhythm.     Heart sounds: Normal heart sounds.  Pulmonary:     Effort: Pulmonary effort is normal.     Breath sounds: Normal breath sounds. No wheezing or rales.  Abdominal:     General: Bowel sounds are normal. There is no distension.     Palpations: Abdomen is soft.     Tenderness: There is no abdominal tenderness. There is no guarding.  Musculoskeletal:        General: Normal range of motion.     Cervical back: Normal range of motion and neck supple.  Skin:    General: Skin is warm and dry.  Neurological:     Mental Status: She is alert and oriented to person, place, and time.  Psychiatric:        Mood and Affect: Mood normal.        Thought Content: Thought content normal.     UC Treatments / Results  Labs (all labs ordered are listed, but only abnormal results are displayed) Labs Reviewed - No data to display  EKG   Radiology No results found.  Procedures Procedures (including critical care time)  Medications Ordered in UC Medications - No data to display  Initial Impression / Assessment and Plan / UC Course  I have reviewed the triage vital signs and the nursing notes.  Pertinent labs & imaging results that were available during my care of the patient were reviewed by me and considered in my medical decision making (see chart for details).     Vital signs benign and reassuring with oxygen saturation 100% on room air, exam also overall reassuring, lungs clear to auscultation bilaterally and speaking in full sentences.  No evidence of a secondary bacterial infection today, suspect postviral inflammatory cough.  We will treat with prednisone burst, Phenergan DM and discussed supportive over-the-counter medications and home care.  Work note given for time missed.  Return for worsening symptoms.  Final Clinical  Impressions(s) / UC Diagnoses   Final diagnoses:  COVID-19  Acute cough   Discharge Instructions   None    ED Prescriptions     Medication Sig  Dispense Auth. Provider   predniSONE (DELTASONE) 20 MG tablet Take 2 tablets (40 mg total) by mouth daily. 10 tablet Particia Nearing, [place]   promethazine-dextromethorphan (PROMETHAZINE-DM) 6.25-15 MG/5ML syrup Take 5 mLs by mouth 4 (four) times daily as needed. 100 mL Particia Nearing, [place]      PDMP not reviewed this encounter.   Roosvelt Maser [place], [place] 09/06/21 [phone]

## 2021-09-06 NOTE — ED Triage Notes (Signed)
Patient states that last Friday afternoon she had a dry cough only.  Patient states that on Sunday and Monday she had body aches and felt like a truck had run over her  Patient states that she feels a little better but she can not get rid of the cough and the congestion.   Patient states it has been in her for 9 days and she has been out of work for 6 days  Patient states she tried TheraFlu and Mucinex  Denies Fever

## 2021-11-19 IMAGING — DX DG CHEST 2V
2 series · 2 of 2 positions shown · non-contrast
Comparison: Chest radiograph March 21, 2013

CLINICAL DATA: Chest pain and tightness with breathing. Sinus
congestion.

EXAM:
CHEST - 2 VIEW

[chest pa]
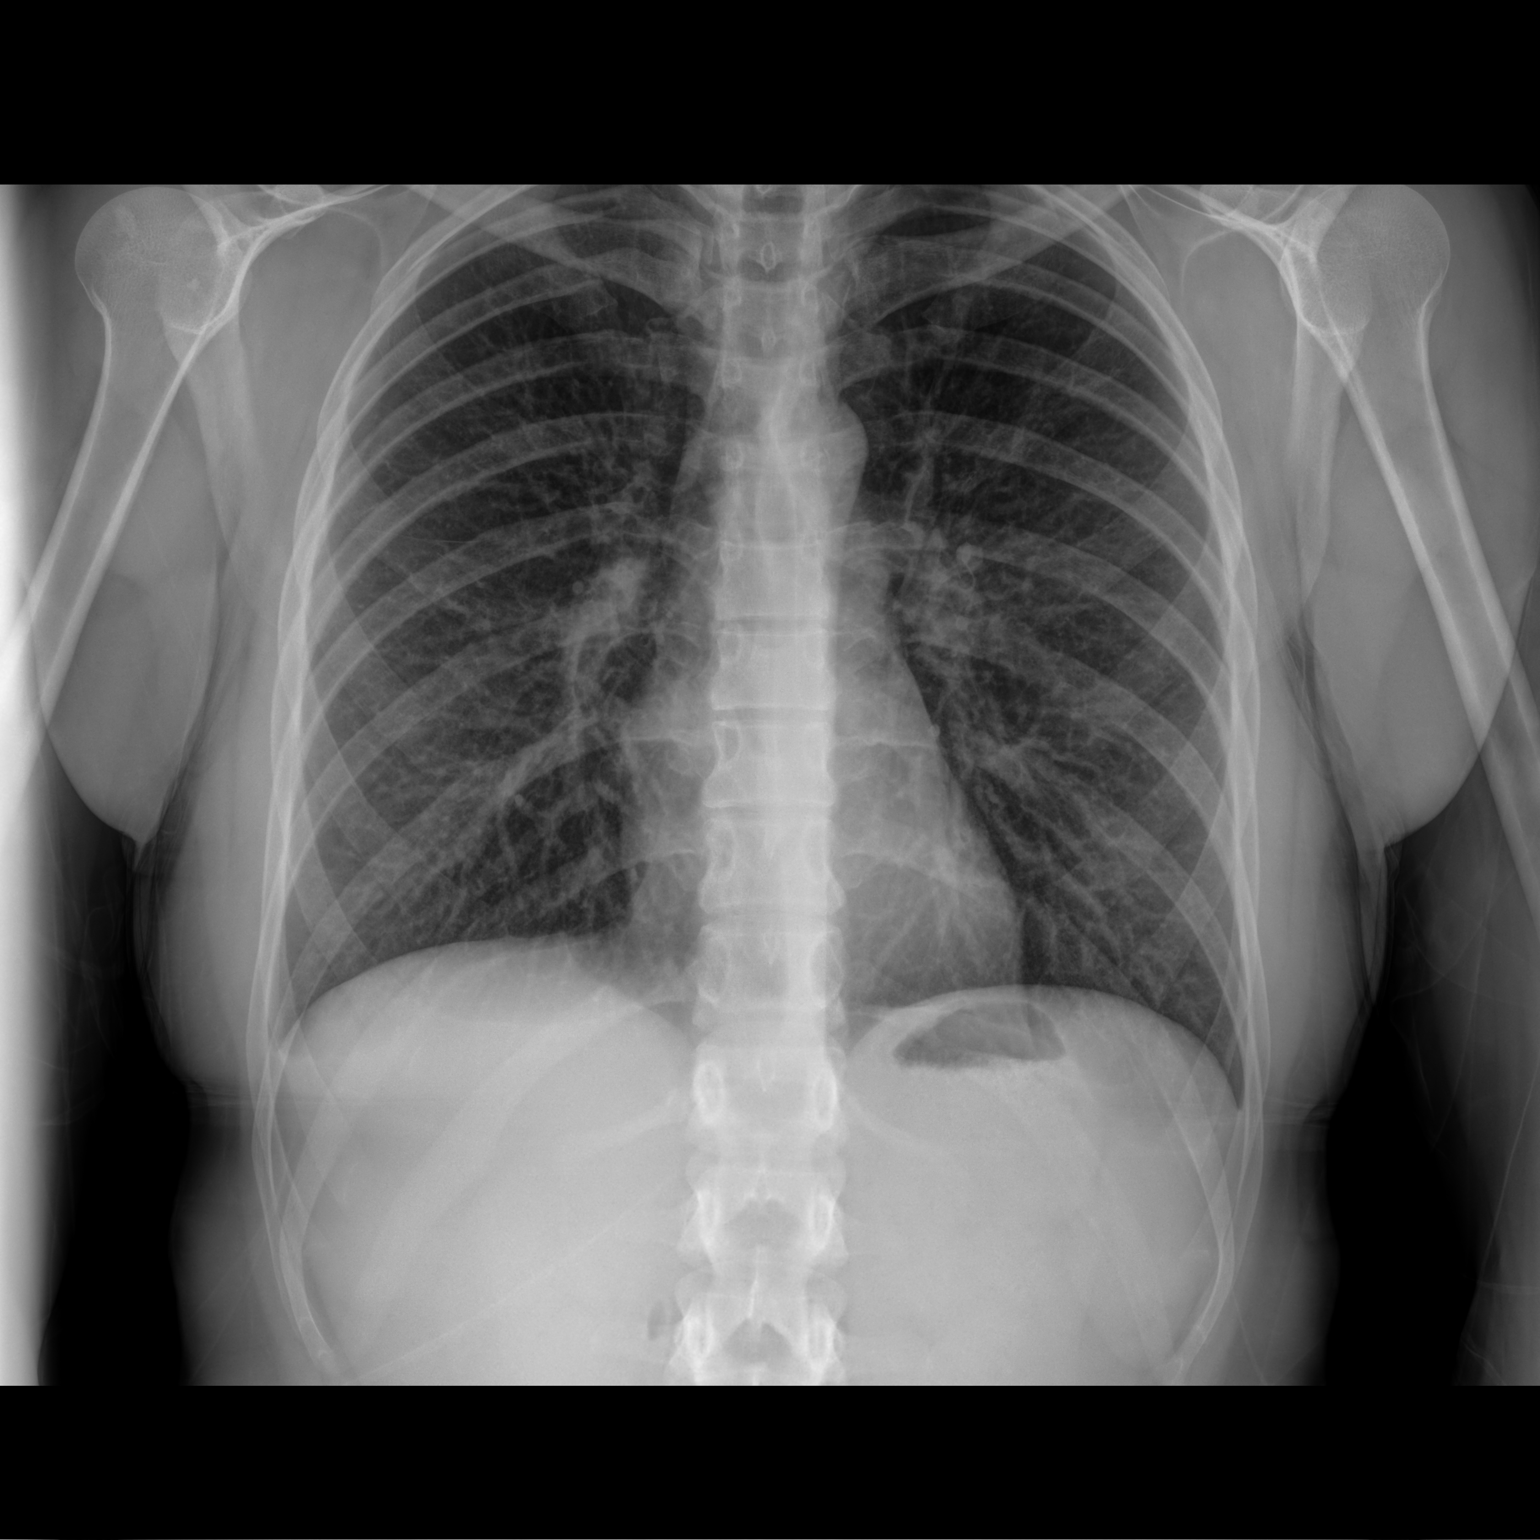

[chest lat]
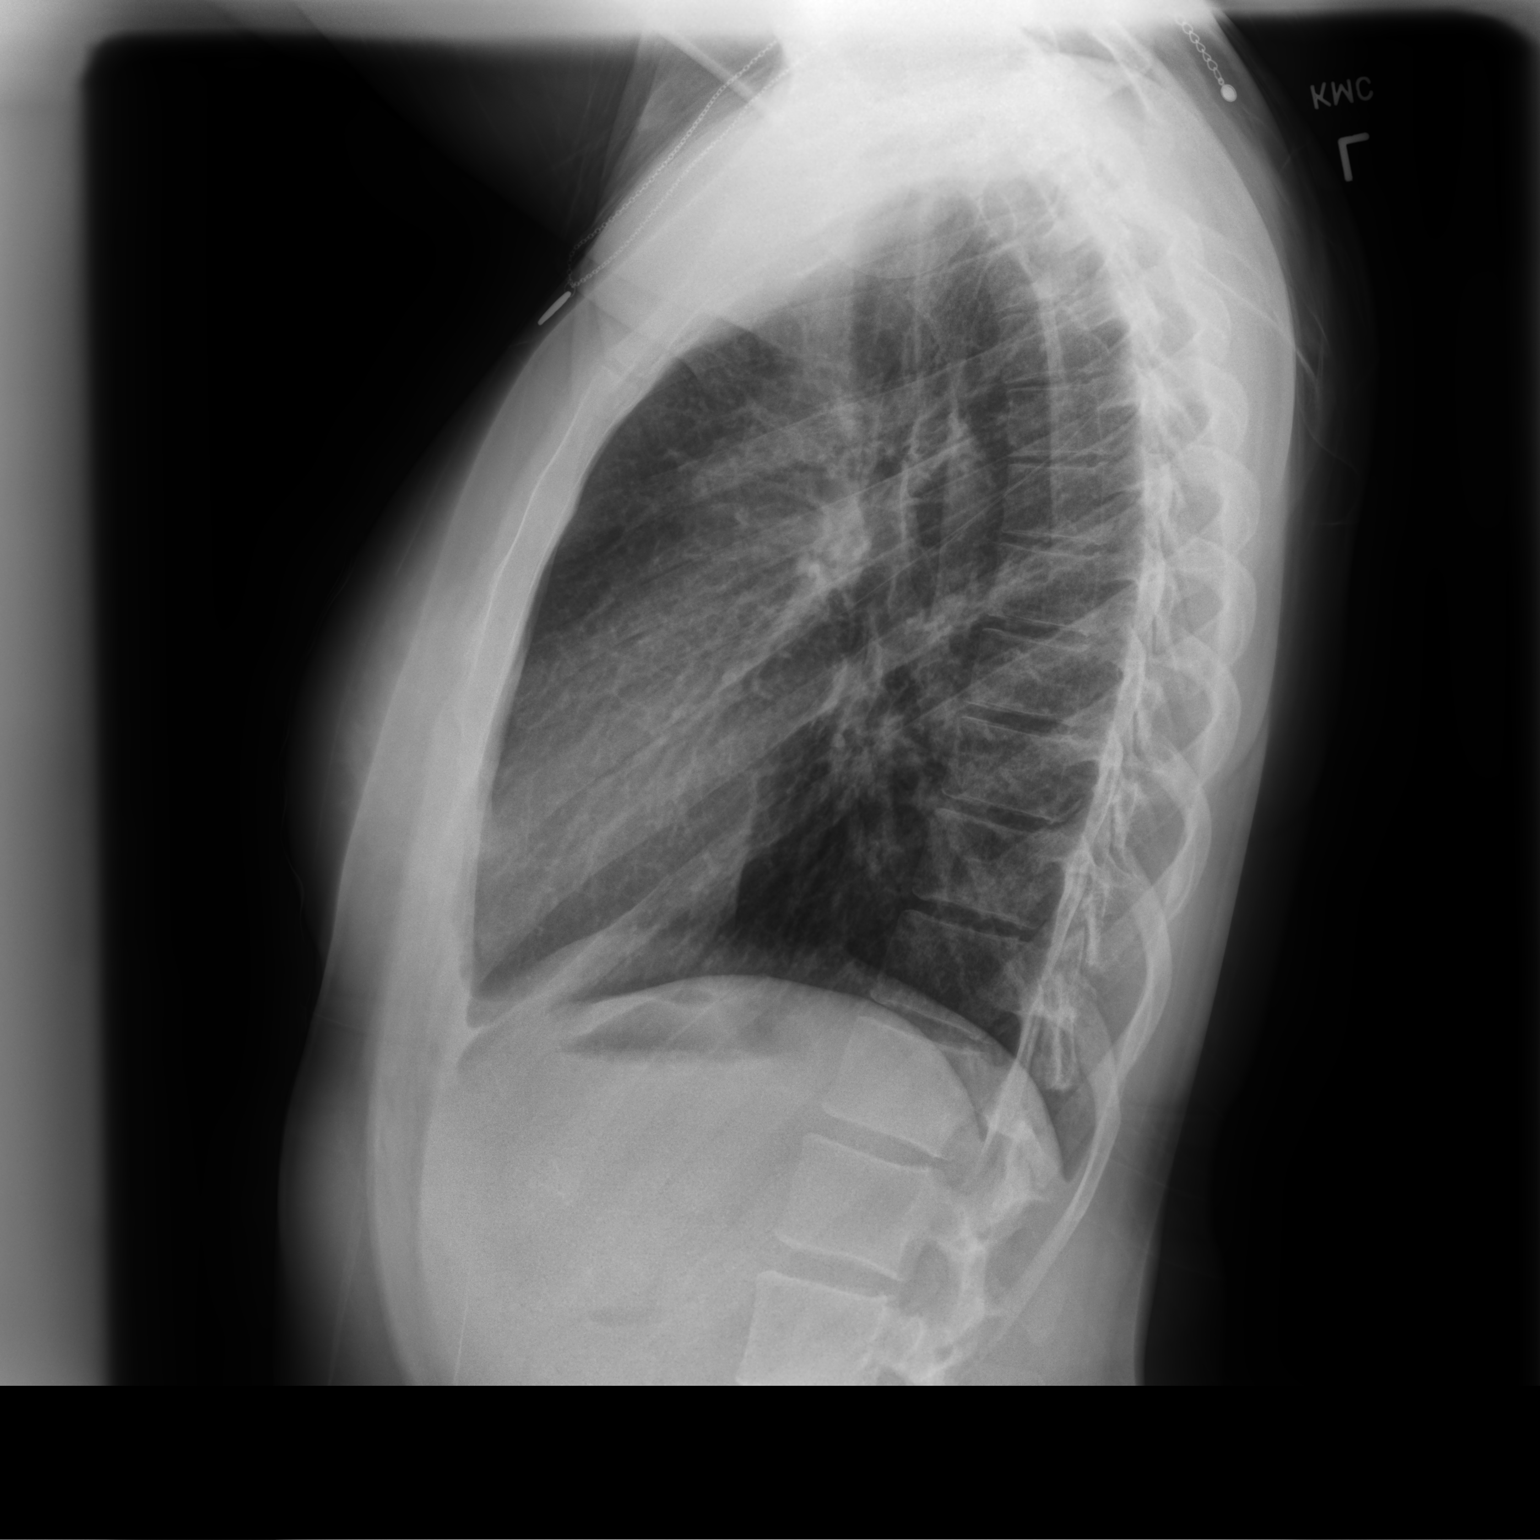

[2 of 2 positions shown; findings below may reference images not displayed]

FINDINGS: The heart size and mediastinal contours are within normal limits. No
focal consolidation. No pleural effusion. No pneumothorax. Two small
dense sclerotic lesions in the right humeral head, likely benign
bone islands.
IMPRESSION: No acute cardiopulmonary disease.

## 2023-01-08 ENCOUNTER — Other Ambulatory Visit: Payer: Self-pay | Admitting: Family

## 2023-01-08 DIAGNOSIS — R1900 Intra-abdominal and pelvic swelling, mass and lump, unspecified site: Secondary | ICD-10-CM

## 2023-01-14 ENCOUNTER — Ambulatory Visit
Admission: RE | Admit: 2023-01-14 | Discharge: 2023-01-14 | Disposition: A | Discharge status: Home / Self Care | Payer: BC Managed Care – PPO | Source: Ambulatory Visit | Attending: Family | Admitting: Family

## 2023-01-14 DIAGNOSIS — R1900 Intra-abdominal and pelvic swelling, mass and lump, unspecified site: Secondary | ICD-10-CM

## 2023-01-14 MED ORDER — GADOPICLENOL 0.5 MMOL/ML IV SOLN
7.0000 mL | Freq: Once | INTRAVENOUS | Status: AC | PRN
  Administered 2023-01-14: 7 mL via INTRAVENOUS

## 2023-03-08 ENCOUNTER — Other Ambulatory Visit: Payer: Self-pay | Admitting: Obstetrics and Gynecology

## 2023-04-05 ENCOUNTER — Encounter: Payer: Self-pay | Admitting: Adult Health

## 2023-04-05 ENCOUNTER — Ambulatory Visit: Payer: BC Managed Care – PPO | Admitting: Adult Health

## 2023-04-05 VITALS — BP 106/60 | HR 71 | Ht 65.0 in | Wt 125.5 lb

## 2023-04-05 DIAGNOSIS — R102 Pelvic and perineal pain: Secondary | ICD-10-CM | POA: Diagnosis not present

## 2023-04-05 DIAGNOSIS — E282 Polycystic ovarian syndrome: Secondary | ICD-10-CM | POA: Diagnosis not present

## 2023-04-05 DIAGNOSIS — N80129 Deep endometriosis of ovary, unspecified ovary: Secondary | ICD-10-CM | POA: Insufficient documentation

## 2023-04-05 MED ORDER — MISOPROSTOL 200 MCG PO TABS: ORAL_TABLET | ORAL | 0 refills | Status: DC

## 2023-04-05 NOTE — Progress Notes (Signed)
Subjective:     Patient ID: Elizabeth Nguyen, female   DOB: 03/28/1996, 28 y.o.   MRN: [id number]  HPI Elizabeth Nguyen is a 27 year old white female,single, G0P0, in wanting to discuss cysts on ovary. She has history of pelvic pain esp LLQ, and pain/ pressure with periods.  She teaches third grade. She was offered Pollie Friar but was too expensive and too many side effects.  She MRI 01/14/23  that showed:  IMPRESSION: 1. There are 2 soft tissue masses identified along the left pelvic sidewall and anterior aspect of the lower uterine segment and cervix. The larger of these has signal characteristics compatible with blood products. The smaller lesion has signal characteristics also suggestive of underlying blood products. These are favored to represent endometriomas. 2. Multiple prominent peripheral follicles are identified within the ovaries. Correlate for any clinical signs or symptoms of polycystic ovarian syndrome. 3. Small volume of ascites within the pelvis. No discrete fluid  She has had [place] too.  She has seen [facility] for [facility] in [place] and [facility] and is currently on BCP and is good unless forgets a pill. She thought she was going to have surgery to remove endometriomas and endometriosis and get IUD, but did not have that. Dr Jeannie Fend has her for surgery 06/29/23.  Last pap was normal she said, and has been within last year.  [facility] is [facility].  Review of Systems +pelvic pain +pelvic pressure Reviewed past medical,surgical, social and family history. Reviewed medications and allergies.     Objective:   Physical Exam BP 106/60 (BP Location: Right Arm, Patient Position: Sitting, Cuff Size: Normal)   Pulse 71   Ht 5\' 5"  (1.651 m)   Wt 125 lb 8 oz (56.9 kg)   LMP 03/29/2023   BMI 20.88 kg/m     Skin warm and dry.  Lungs: clear to ausculation bilaterally. Cardiovascular: regular rate and rhythm.  AA is 1 Fall risk is low    04/05/2023   10:16 AM  Depression screen PHQ 2/9   Decreased Interest 0  Down, Depressed, Hopeless 0  PHQ - 2 Score 0  Altered sleeping 0  Tired, decreased energy 0  Change in appetite 0  Feeling bad or failure about yourself  0  Trouble concentrating 0  Moving slowly or fidgety/restless 0  Suicidal thoughts 0  PHQ-9 Score 0       04/05/2023   10:16 AM  GAD 7 : Generalized Anxiety Score  Nervous, Anxious, on Edge 1  Control/stop worrying 1  Worry too much - different things 1  Trouble relaxing 1  Restless 0  Easily annoyed or irritable 0  Afraid - awful might happen 0  Total GAD 7 Score 4    Upstream - 04/05/23 1028       Pregnancy Intention Screening   Does the patient want to become pregnant in the next year? No    Does the patient's partner want to become pregnant in the next year? No    Would the patient like to discuss contraceptive options today? No      Contraception Wrap Up   Current Method Abstinence;Oral Contraceptive    End Method Abstinence;Oral Contraceptive    Contraception Counseling Provided Yes               Assessment:     1. Pelvic pain Has pain when not taking BCP Will try IUD and keep surgery on til can see how IUD works  Discussed with Dr Charlotta Newton, who  said she would place IUD  2. Pelvic pressure in female +pressure   3. PCO (polycystic ovaries) Seen multiple follicles left ovary on MRI  4. Endometrioma Seen on MRI    Reviewed MRI and records  Plan:    Return in 10 days for IUD insertion with Dr Charlotta Newton Handout given on mirena Will rx cytotec prior to IUD insertion Meds ordered this encounter  Medications   misoprostol (CYTOTEC) 200 MCG tablet    Sig: Take 2 tablets po 4 hours prior to IUD insertion    Dispense:  2 tablet    Refill:  0    Order Specific Question:   Supervising Provider    Answer:   Duane Lope H [[id number]]

## 2023-04-15 ENCOUNTER — Ambulatory Visit: Payer: BC Managed Care – PPO | Admitting: Obstetrics & Gynecology

## 2023-04-15 ENCOUNTER — Encounter: Payer: Self-pay | Admitting: Obstetrics & Gynecology

## 2023-04-15 VITALS — BP 102/64 | HR 69 | Ht 65.0 in | Wt 123.6 lb

## 2023-04-15 DIAGNOSIS — E282 Polycystic ovarian syndrome: Secondary | ICD-10-CM

## 2023-04-15 DIAGNOSIS — R102 Pelvic and perineal pain: Secondary | ICD-10-CM

## 2023-04-15 DIAGNOSIS — N80129 Deep endometriosis of ovary, unspecified ovary: Secondary | ICD-10-CM

## 2023-04-15 NOTE — Progress Notes (Signed)
**Note Elizabeth-Identified via Obfuscation**    GYN VISIT Patient name: DEMELZA Nguyen MRN [id number]  Date of birth: [birth year] Chief Complaint:   IUD insertion  History of Present Illness:   Elizabeth Nguyen is a 27 y.o. G0P0000 female being seen today for 2nd opinion/evaluation of the following:     Pelvic pain: This has been an ongoing issue since April.  Seen by [facility] and Dr. Jeannie Fend. records have been obtained and in chart.  Recent MRI completed 12/2022: Along left pelvic sidewall adjacent to the left ovary enhancing soft tissue mass measuring 2.3 x 2.0 cm.  Along the anterior surface of lower uterine segment a 2.6 x 1.4 cm lesion noted.  These are likely to be endometriomas and patient has been scheduled with surgical intervention with Dr. April Manson in November.  He advised Elizabeth Nguyen which she has decided against due to not only cost but potential side effects which she has read about.  At this time she was considering a Mirena to help manage her endometriosis.  She does not desire pregnancy currently  Currently on OCPs (continuous) and the pill is working ok to control pain and the irregular bleeding; however, if she is even late to take a pill symptoms will return.  Patient's last menstrual period was 03/29/2023.    Review of Systems:   Pertinent items are noted in HPI Denies fever/chills, dizziness, headaches, visual disturbances, fatigue, shortness of breath, chest pain, abdominal pain, vomiting. Pertinent History Reviewed:   Past Surgical History:  Procedure Laterality Date   Lt femor     Repaired ligaments rt wrist      Past Medical History:  Diagnosis Date   Asthma    [place]one syndrome    Reviewed problem list, medications and allergies. Physical Assessment:   Vitals:   04/15/23 1027  BP: 102/64  Pulse: 69  Weight: 123 lb 9.6 oz (56.1 kg)  Height: 5\' 5"  (1.651 m)  Body mass index is 20.57 kg/m.       Physical Examination:   General appearance: alert, well appearing, and in no  distress  Psych: mood appropriate, normal affect  Skin: warm & dry   Cardiovascular: normal heart rate noted  Respiratory: normal respiratory effort, no distress  Abd/GU:  not performed as it would not change management and very uncomfortable for patient  Extremities: no edema   Chaperone: N/A    Assessment & Plan:  1) Pelvic pain, Endometriosis -Long discussion with patient regarding diagnosis of endometriosis, symptoms and management options -At this point knowing that continuous OCPs are working for her would advise that she continue with this option -Discussed IUD as a potential treatment should she have failure of continuous OCPs. -My concern is that a side effect of IUD placement can be irregular bleeding and cramping, which she is currently dealing with at present -Advised that she follow-up as scheduled with Dr. April Manson for surgical intervention and postsurgery either continue OCPs and/or placement of IUD at the time of surgery -Questions and concerns were addressed patient was agreeable with plan as outlined above    Return for May 2025 annual with Dr. Charlotta Newton.   Myna Hidalgo, DO Attending Obstetrician & Gynecologist, [facility] for [employer], [facility]

## 2023-06-23 ENCOUNTER — Encounter (HOSPITAL_BASED_OUTPATIENT_CLINIC_OR_DEPARTMENT_OTHER): Payer: Self-pay | Admitting: Obstetrics and Gynecology

## 2023-06-23 ENCOUNTER — Other Ambulatory Visit: Payer: Self-pay

## 2023-06-23 NOTE — Progress Notes (Signed)
Spoke w/ via phone for pre-op interview: patient  Lab needs dos: UPT; CBC, RPR, and T&S per surgeon Lab results: NA COVID test: patient states asymptomatic no test needed. Arrive at 10AM 06/29/23 NPO after MN except clear liquids. Clear liquids from MN until 0900 Med rec completed. Medications to take morning of surgery: Loestrin Diabetic medication: NA Patient instructed no nail fingernail polish to be worn day of surgery- at least one finger polish free on each hand. Patient instructed to bring photo id and insurance card day of surgery. Patient aware to have driver (ride ) / caregiver for 24 hours after surgery. Darene Lamer to drive. Special Instructions: NA Patient verbalized understanding of instructions that were given at this phone interview. Patient denies shortness of breath, chest pain, fever, cough at this phone interview.

## 2023-06-29 ENCOUNTER — Encounter (HOSPITAL_BASED_OUTPATIENT_CLINIC_OR_DEPARTMENT_OTHER): Payer: Self-pay | Admitting: Obstetrics and Gynecology

## 2023-06-29 ENCOUNTER — Other Ambulatory Visit: Payer: Self-pay

## 2023-06-29 ENCOUNTER — Ambulatory Visit (HOSPITAL_BASED_OUTPATIENT_CLINIC_OR_DEPARTMENT_OTHER): Payer: BC Managed Care – PPO | Admitting: Anesthesiology

## 2023-06-29 ENCOUNTER — Encounter (HOSPITAL_BASED_OUTPATIENT_CLINIC_OR_DEPARTMENT_OTHER)
Admission: RE | Disposition: A | Discharge status: Home / Self Care | Payer: Self-pay | Source: Home / Self Care | Attending: Obstetrics and Gynecology

## 2023-06-29 ENCOUNTER — Ambulatory Visit (HOSPITAL_BASED_OUTPATIENT_CLINIC_OR_DEPARTMENT_OTHER)
Admission: RE | Admit: 2023-06-29 | Discharge: 2023-06-29 | Disposition: A | Discharge status: Home / Self Care | Payer: BC Managed Care – PPO | Attending: Obstetrics and Gynecology | Admitting: Obstetrics and Gynecology

## 2023-06-29 DIAGNOSIS — J45909 Unspecified asthma, uncomplicated: Secondary | ICD-10-CM | POA: Insufficient documentation

## 2023-06-29 DIAGNOSIS — N803C3 Endometriosis of bilateral uterosacral ligament(s), unspecified depth: Secondary | ICD-10-CM | POA: Diagnosis not present

## 2023-06-29 DIAGNOSIS — N80103 Endometriosis of bilateral ovaries, unspecified depth: Secondary | ICD-10-CM | POA: Insufficient documentation

## 2023-06-29 DIAGNOSIS — G8929 Other chronic pain: Secondary | ICD-10-CM | POA: Diagnosis not present

## 2023-06-29 DIAGNOSIS — Z30430 Encounter for insertion of intrauterine contraceptive device: Secondary | ICD-10-CM | POA: Insufficient documentation

## 2023-06-29 DIAGNOSIS — Z01818 Encounter for other preprocedural examination: Secondary | ICD-10-CM

## 2023-06-29 DIAGNOSIS — N80329 Endometriosis of the posterior cul-de-sac, unspecified depth: Secondary | ICD-10-CM | POA: Diagnosis not present

## 2023-06-29 HISTORY — PX: ROBOTIC ASSISTED LAPAROSCOPIC LYSIS OF ADHESION: SHX6080

## 2023-06-29 HISTORY — DX: Cardiac murmur, unspecified: R01.1

## 2023-06-29 HISTORY — PX: CHROMOPERTUBATION: SHX6288

## 2023-06-29 HISTORY — PX: INTRAUTERINE DEVICE (IUD) INSERTION: SHX5877

## 2023-06-29 LAB — CBC
HCT: 42.9 % (ref 36.0–46.0)
Hemoglobin: 14.5 g/dL (ref 12.0–15.0)
MCH: 28.4 pg (ref 26.0–34.0)
MCHC: 33.8 g/dL (ref 30.0–36.0)
MCV: 84 fL (ref 80.0–100.0)
Platelets: 243 10*3/uL (ref 150–400)
RBC: 5.11 MIL/uL (ref 3.87–5.11)
RDW: 13.2 % (ref 11.5–15.5)
WBC: 6.1 10*3/uL (ref 4.0–10.5)
nRBC: 0 % (ref 0.0–0.2)

## 2023-06-29 LAB — TYPE AND SCREEN
ABO/RH(D): O POS
Antibody Screen: NEGATIVE

## 2023-06-29 LAB — ABO/RH: ABO/RH(D): O POS

## 2023-06-29 LAB — POCT PREGNANCY, URINE: Preg Test, Ur: NEGATIVE

## 2023-06-29 SURGERY — LYSIS, ADHESIONS, ROBOT-ASSISTED, LAPAROSCOPIC
Anesthesia: General | Site: Uterus

## 2023-06-29 MED ORDER — DEXAMETHASONE SODIUM PHOSPHATE 10 MG/ML IJ SOLN
INTRAMUSCULAR | Status: DC | PRN
  Administered 2023-06-29 (×2): 5 mg via INTRAVENOUS

## 2023-06-29 MED ORDER — ACETAMINOPHEN 10 MG/ML IV SOLN
INTRAVENOUS | Status: DC | PRN
  Administered 2023-06-29: 1000 mg via INTRAVENOUS

## 2023-06-29 MED ORDER — FENTANYL CITRATE (PF) 100 MCG/2ML IJ SOLN
25.0000 ug | INTRAMUSCULAR | Status: DC | PRN
  Administered 2023-06-29 (×3): 50 ug via INTRAVENOUS

## 2023-06-29 MED ORDER — LIDOCAINE 2% (20 MG/ML) 5 ML SYRINGE
INTRAMUSCULAR | Status: DC | PRN
  Administered 2023-06-29: 40 mg via INTRAVENOUS

## 2023-06-29 MED ORDER — FENTANYL CITRATE (PF) 100 MCG/2ML IJ SOLN
INTRAMUSCULAR | Status: AC
  Filled 2023-06-29: qty 2

## 2023-06-29 MED ORDER — KETOROLAC TROMETHAMINE 30 MG/ML IJ SOLN
INTRAMUSCULAR | Status: AC
  Filled 2023-06-29: qty 1

## 2023-06-29 MED ORDER — ONDANSETRON HCL 4 MG/2ML IJ SOLN
INTRAMUSCULAR | Status: AC
  Filled 2023-06-29: qty 2

## 2023-06-29 MED ORDER — ONDANSETRON HCL 4 MG PO TABS: 4.0000 mg | ORAL_TABLET | Freq: Every day | ORAL | 1 refills | Status: AC | PRN

## 2023-06-29 MED ORDER — STERILE WATER FOR IRRIGATION IR SOLN
Status: DC | PRN
  Administered 2023-06-29: 500 mL

## 2023-06-29 MED ORDER — MIDAZOLAM HCL 2 MG/2ML IJ SOLN
INTRAMUSCULAR | Status: AC
  Filled 2023-06-29: qty 2

## 2023-06-29 MED ORDER — DEXMEDETOMIDINE HCL IN NACL 80 MCG/20ML IV SOLN
INTRAVENOUS | Status: DC | PRN
  Administered 2023-06-29 (×2): 8 ug via INTRAVENOUS

## 2023-06-29 MED ORDER — DROPERIDOL 2.5 MG/ML IJ SOLN: 0.6250 mg | Freq: Once | INTRAMUSCULAR | Status: DC | PRN

## 2023-06-29 MED ORDER — BUPIVACAINE HCL (PF) 0.25 % IJ SOLN
INTRAMUSCULAR | Status: DC | PRN
  Administered 2023-06-29: 10 mL

## 2023-06-29 MED ORDER — MIDAZOLAM HCL 2 MG/2ML IJ SOLN
INTRAMUSCULAR | Status: DC | PRN
  Administered 2023-06-29: 2 mg via INTRAVENOUS

## 2023-06-29 MED ORDER — ONDANSETRON HCL 4 MG/2ML IJ SOLN
INTRAMUSCULAR | Status: DC | PRN
  Administered 2023-06-29: 4 mg via INTRAVENOUS

## 2023-06-29 MED ORDER — PROPOFOL 10 MG/ML IV BOLUS
INTRAVENOUS | Status: AC
  Filled 2023-06-29: qty 20

## 2023-06-29 MED ORDER — OXYCODONE HCL 5 MG PO TABS
ORAL_TABLET | ORAL | Status: AC
  Filled 2023-06-29: qty 1

## 2023-06-29 MED ORDER — DEXAMETHASONE SODIUM PHOSPHATE 10 MG/ML IJ SOLN
INTRAMUSCULAR | Status: AC
  Filled 2023-06-29: qty 1

## 2023-06-29 MED ORDER — ACETAMINOPHEN 10 MG/ML IV SOLN
INTRAVENOUS | Status: AC
  Filled 2023-06-29: qty 100

## 2023-06-29 MED ORDER — ROCURONIUM BROMIDE 10 MG/ML (PF) SYRINGE
PREFILLED_SYRINGE | INTRAVENOUS | Status: AC
  Filled 2023-06-29: qty 10

## 2023-06-29 MED ORDER — FENTANYL CITRATE (PF) 100 MCG/2ML IJ SOLN
INTRAMUSCULAR | Status: DC | PRN
  Administered 2023-06-29 (×4): 50 ug via INTRAVENOUS

## 2023-06-29 MED ORDER — SODIUM CHLORIDE 0.9 % IV SOLN
INTRAVENOUS | Status: DC | PRN
Start: 2023-06-29 — End: 2023-06-29

## 2023-06-29 MED ORDER — WHITE PETROLATUM EX OINT
TOPICAL_OINTMENT | CUTANEOUS | Status: AC
  Filled 2023-06-29: qty 5

## 2023-06-29 MED ORDER — PROPOFOL 10 MG/ML IV BOLUS
INTRAVENOUS | Status: DC | PRN
  Administered 2023-06-29: 130 mg via INTRAVENOUS

## 2023-06-29 MED ORDER — CEFAZOLIN SODIUM 1 G IJ SOLR
INTRAMUSCULAR | Status: AC
  Filled 2023-06-29: qty 20

## 2023-06-29 MED ORDER — SUGAMMADEX SODIUM 200 MG/2ML IV SOLN
INTRAVENOUS | Status: DC | PRN
  Administered 2023-06-29: 150 mg via INTRAVENOUS

## 2023-06-29 MED ORDER — LIDOCAINE HCL (PF) 2 % IJ SOLN
INTRAMUSCULAR | Status: AC
  Filled 2023-06-29: qty 5

## 2023-06-29 MED ORDER — LACTATED RINGERS IV SOLN: INTRAVENOUS | Status: DC

## 2023-06-29 MED ORDER — CEFAZOLIN SODIUM-DEXTROSE 2-3 GM-%(50ML) IV SOLR
INTRAVENOUS | Status: DC | PRN
  Administered 2023-06-29: 2 g via INTRAVENOUS

## 2023-06-29 MED ORDER — LEVONORGESTREL 20 MCG/DAY IU IUD
1.0000 | INTRAUTERINE_SYSTEM | Freq: Once | INTRAUTERINE | Status: AC
  Administered 2023-06-29: 1 via INTRAUTERINE

## 2023-06-29 MED ORDER — KETOROLAC TROMETHAMINE 30 MG/ML IJ SOLN
INTRAMUSCULAR | Status: DC | PRN
  Administered 2023-06-29: 30 mg via INTRAVENOUS

## 2023-06-29 MED ORDER — OXYCODONE-ACETAMINOPHEN 7.5-325 MG PO TABS: 1.0000 | ORAL_TABLET | ORAL | 0 refills | Status: AC | PRN

## 2023-06-29 MED ORDER — HEMOSTATIC AGENTS (NO CHARGE) OPTIME
TOPICAL | Status: DC | PRN
  Administered 2023-06-29: 1

## 2023-06-29 MED ORDER — LEVONORGESTREL 20 MCG/DAY IU IUD
INTRAUTERINE_SYSTEM | INTRAUTERINE | Status: AC
  Filled 2023-06-29: qty 1

## 2023-06-29 MED ORDER — OXYCODONE HCL 5 MG PO TABS
5.0000 mg | ORAL_TABLET | Freq: Once | ORAL | Status: AC | PRN
  Administered 2023-06-29: 5 mg via ORAL

## 2023-06-29 MED ORDER — OXYCODONE HCL 5 MG/5ML PO SOLN: 5.0000 mg | Freq: Once | ORAL | Status: AC | PRN

## 2023-06-29 MED ORDER — SODIUM CHLORIDE 0.9 % IR SOLN
Status: DC | PRN
  Administered 2023-06-29: 500 mL

## 2023-06-29 MED ORDER — POVIDONE-IODINE 10 % EX SWAB: 2.0000 | Freq: Once | CUTANEOUS | Status: DC

## 2023-06-29 MED ORDER — ROCURONIUM BROMIDE 10 MG/ML (PF) SYRINGE
PREFILLED_SYRINGE | INTRAVENOUS | Status: DC | PRN
  Administered 2023-06-29: 50 mg via INTRAVENOUS
  Administered 2023-06-29: 10 mg via INTRAVENOUS

## 2023-06-29 SURGICAL SUPPLY — 53 items
ADH SKN CLS APL DERMABOND .7 (GAUZE/BANDAGES/DRESSINGS) ×3
BARRIER ADHS 3X4 INTERCEED (GAUZE/BANDAGES/DRESSINGS) IMPLANT
BRR ADH 4X3 ABS CNTRL BYND (GAUZE/BANDAGES/DRESSINGS)
BRR ADH 6X5 SEPRAFILM 1 SHT (MISCELLANEOUS) ×6
CATH FOLEY 3WAY 5CC 16FR (CATHETERS) ×3 IMPLANT
COVER BACK TABLE 60X90IN (DRAPES) ×6 IMPLANT
COVER TIP SHEARS 8 DVNC (MISCELLANEOUS) ×3 IMPLANT
DEFOGGER SCOPE WARMER CLEARIFY (MISCELLANEOUS) ×3 IMPLANT
DERMABOND ADVANCED .7 DNX12 (GAUZE/BANDAGES/DRESSINGS) ×3 IMPLANT
DRAPE ARM DVNC X/XI (DISPOSABLE) ×9 IMPLANT
DRAPE COLUMN DVNC XI (DISPOSABLE) ×3 IMPLANT
DRAPE SURG IRRIG POUCH 19X23 (DRAPES) ×3 IMPLANT
DRIVER NDL MEGA SUTCUT DVNCXI (INSTRUMENTS) ×3 IMPLANT
DRIVER NDLE MEGA SUTCUT DVNCXI (INSTRUMENTS)
DRSG OPSITE POSTOP 3X4 (GAUZE/BANDAGES/DRESSINGS) ×3 IMPLANT
DURAPREP 26ML APPLICATOR (WOUND CARE) ×3 IMPLANT
ELECT REM PT RETURN 9FT ADLT (ELECTROSURGICAL) ×3
ELECTRODE REM PT RTRN 9FT ADLT (ELECTROSURGICAL) ×3 IMPLANT
FORCEPS BPLR 8 MD DVNC XI (FORCEP) IMPLANT
GAUZE 4X4 16PLY ~~LOC~~+RFID DBL (SPONGE) ×6 IMPLANT
GLOVE BIO SURGEON STRL SZ8 (GLOVE) ×9 IMPLANT
GLOVE BIOGEL PI IND STRL 7.0 (GLOVE) ×6 IMPLANT
GLOVE BIOGEL PI IND STRL 8.5 (GLOVE) ×9 IMPLANT
IRRIG SUCT STRYKERFLOW 2 WTIP (MISCELLANEOUS) ×3
IRRIGATION SUCT STRKRFLW 2 WTP (MISCELLANEOUS) ×3 IMPLANT
KIT PINK PAD W/HEAD ARE REST (MISCELLANEOUS) ×3
KIT PINK PAD W/HEAD ARM REST (MISCELLANEOUS) ×3 IMPLANT
KIT TURNOVER CYSTO (KITS) ×3 IMPLANT
MANIPULATOR UTERINE 4.5 ZUMI (MISCELLANEOUS) ×3 IMPLANT
Mirena IUD IMPLANT
NDL INSUFFLATION 14GA 120MM (NEEDLE) ×3 IMPLANT
NEEDLE INSUFFLATION 14GA 120MM (NEEDLE) ×3
NS IRRIG 1000ML POUR BTL (IV SOLUTION) ×3 IMPLANT
OBTURATOR OPTICAL STND 8 DVNC (TROCAR) ×3
OBTURATOR OPTICALSTD 8 DVNC (TROCAR) IMPLANT
PACK ROBOT WH (CUSTOM PROCEDURE TRAY) ×3 IMPLANT
PACK ROBOTIC GOWN (GOWN DISPOSABLE) ×3 IMPLANT
PAD PREP 24X48 CUFFED NSTRL (MISCELLANEOUS) ×3 IMPLANT
SCISSORS MNPLR CVD DVNC XI (INSTRUMENTS) ×3 IMPLANT
SEAL UNIV 5-12 XI (MISCELLANEOUS) ×9 IMPLANT
SEPRAFILM MEMBRANE 5X6 (MISCELLANEOUS) IMPLANT
SET IRRIG Y TYPE TUR BLADDER L (SET/KITS/TRAYS/PACK) IMPLANT
SET TRI-LUMEN FLTR TB AIRSEAL (TUBING) IMPLANT
SHEARS HARMONIC 36 ACE (MISCELLANEOUS) IMPLANT
SLEEVE SCD COMPRESS KNEE MED (STOCKING) ×3 IMPLANT
SPIKE FLUID TRANSFER (MISCELLANEOUS) ×6 IMPLANT
SPONGE T-LAP 4X18 ~~LOC~~+RFID (SPONGE) ×3 IMPLANT
STOPCOCK 4 WAY LG BORE MALE ST (IV SETS) IMPLANT
SUT MNCRL AB 4-0 PS2 18 (SUTURE) IMPLANT
SYR 50ML LL SCALE MARK (SYRINGE) IMPLANT
TOWEL OR 17X24 6PK STRL BLUE (TOWEL DISPOSABLE) ×6 IMPLANT
TROCAR Z-THREAD BLADED 5X100MM (TROCAR) IMPLANT
WATER STERILE IRR 1000ML POUR (IV SOLUTION) ×3 IMPLANT

## 2023-06-29 NOTE — Anesthesia Procedure Notes (Signed)
Procedure Name: Intubation Date/Time: 06/29/2023 1:21 PM  Performed by: Francie Massing, CRNAPre-anesthesia Checklist: Patient identified, Emergency Drugs available, Suction available and Patient being monitored Patient Re-evaluated:Patient Re-evaluated prior to induction Oxygen Delivery Method: Circle system utilized Preoxygenation: Pre-oxygenation with 100% oxygen Induction Type: IV induction Ventilation: Mask ventilation without difficulty Laryngoscope Size: Mac and 3 Grade View: Grade I Tube type: Oral Tube size: 7.0 mm Number of attempts: 1 Airway Equipment and Method: Stylet and Oral airway Placement Confirmation: ETT inserted through vocal cords under direct vision, positive ETCO2 and breath sounds checked- equal and bilateral Secured at: 21 cm Tube secured with: Tape Dental Injury: Teeth and Oropharynx as per pre-operative assessment

## 2023-06-29 NOTE — Op Note (Signed)
OPERATIVE NOTE  Preoperative diagnosis:  Endometriosis, chronic pelvic pain   Postoperative diagnosis: Stage III endometriosis of ovaries, tubes, pelvic peritoneum and sigmoid serosa, chronic pelvic pain, cervical lesions  Procedure: Laparoscopy, robotic assisted excision and ablation of endometriosis, left fimbrioplasty, chromotubation, cervical biopsy, placement of levonorgestrel intrauterine contraceptive device   Anesthesia: Gen. endotracheal   Surgeon: Fermin Schwab   Assistant: Freda Jackson, RNFA  Complications: None   Estimated blood loss: Less than 20 mL  Specimens:   Anterior cul-de-sac, posterior cul-de-sac, left uterosacral ligament, left pelvic sidewall, right uterosacral ligament, right ovarian fossa lesions as well as right-sided cervical lesions to pathology   Findings: On exam under anesthesia, external genitalia, Bartholin's, Skene's, urethra were normal. The vagina was normal.  There was a 0.5 x 0.5 cm and 0.3 x 0.3 cm smooth-walled lesions at the 9 o'clock position on the ectocervix as well as a rough surface in the right vaginal fornix mucosa.  The uterus was anteverted,mobile and normal size.  It sounded to 7-1/2 cm.  No posterior cul-de-sac nodularity was palpable. There were no adnexal masses palpable. On laparoscopy, diaphragm surfaces, liver edge, and gallbladder appeared normal. The appendix was visualized and appeared normal.  Anterior cul-de-sac peritoneum had a pigmented lesion on the left side which was excised. The uterus was grossly normal.  There were pigmented lesions as well as stellate fibrosis in the left ovarian fossa which were mostly excised and partially fulgurated. The left ovary had several superficial pigmented lesions which were ablated with monopolar scissors tips.  In addition the hilum of the ovary and the mesosalpinx of the left tube were involved in endometriosis related fibrosis which was constricting the distal left tube and this area  was carefully lysed and the fibrosis was carefully ablated with the tip of the monopolar scissors using cutting current mode. The left tube was edematous and somewhat dilated due to above-mentioned involvement with endometriosis of its mesosalpinx and it was deconvoluted and fimbrioplasty was performed after lysis of adhesions but maintained its edematous appearance.  The fimbria were rated at 4 out of 5. The left tube was patent to chromotubation. Posterior cul-de-sac as well as both uterosacral ligaments were started with numerous dark pigmented lesions of endometriosis with surrounding stellate fibrosis and all of these areas were excised carefully with monopolar scissors and cutting current mode after performing hydrodissection. The left ovarian fossa similarly had a few scattered dark pigmented lesions as well as stellate fibrosis surrounding them and these areas were also excised after hydrodissection. The little right ovary had few were superficial dark pigmented lesions of endometriosis which were ablated. The right tube was free of endometriosis and it was patent to chromotubation.  The fimbria were rated at 5 out of 5. There was additional stellate fibrosis type of endometriosis on the serosa of the sigmoid colon which was carefully ablated with the tip of the monopolar scissors and cutting current mode.   Description of the procedure: Patient was placed in lithotomy position and general endotracheal anesthesia was given. 2 g of cefazolin were  given intravenously for prophylaxis. She was prepped and draped in sterile manner. A Foley catheter was inserted into the bladder. A ZUMI uterine manipulator was inserted into the uterus for uterine manipulation and chromotubation. The uterus sounded to 8 cm.   An operative field was created on the abdomen and the surgeon was regloved. After preemptive anesthesia with quarter percent bupivacaine, and a intraumbilical skin incision was made. A Verress  needle was inserted and pneumoperitoneum  was created with carbon dioxide.  An 8 mm trocar was placed at this incision. Robotic laparoscope with 3-D camera was inserted and, under direct visualization, 2 left mid abdominal 8 and 5 mm incisions were made  and corresponding robotic trochar and an assisting port were placed. On the right side a second robotic trocar was placed.  The [employer] XI robot was side docked to the patient after placing her in Trendelenburg position. The surgeon continued the rest of the procedure from the surgical console.  Chromotubation showed bilateral patency. We first excised the anterior cul-de-sac peritoneal lesion using monopolar scissors at cutting current setting. We then performed hydrodissection at each of the above-mentioned areas with lesions and excised the lesions with monopolar scissors and cutting current mode.  The superficial lesions on the ovaries and between the hilum of the left ovary and the left mesosalpinx were ablated and the involved adhesions were lysed. We performed left fimbrioplasty by releasing the constricting adhesions around the infundibulum of the left tube and vaporizing with the monopolar scissors tips the lesions of endometriosis on the tubal serosa. Pelvis was copiously irrigated with lactated Ringer solution.  As an adhesion barrier we prepared a slurry of 2 large sheets of Seprafilm and 60 cc of saline and instilled into the pelvis.  The gas was allowed to escape and the instruments were removed.  The instrument and lap pad count were correct x 2.  The skin incisions were closed with 4-0 Monocryl subcuticular stitches.  Seprafilm was applied to the skin.  The surgeon took a seat between the patient's legs and a speculum was inserted and the right side the cervical lesions were biopsied with a cervical biopsy device.  Silver nitrate was applied for hemostasis.  Then a Mirena IUD was placed into the uterus at 7 cm depth and its string was trimmed  off.  The patient tolerated the procedure well and was transferred to recovery room in satisfactory condition.    Fermin Schwab MD

## 2023-06-29 NOTE — H&P (Signed)
Elizabeth Nguyen is a 27 y.o. female , originally referred to me by Dr. Mitchel Nguyen, for surgical treatment of endometriosis causing pain.  She was diagnosed with endometriosis because of ultrasound appearance of an endometrioma and clinical symptoms.  She was placed on continuous Loestrin..  She developed breakthrough bleeding and the pain control has been incomplete. Patient would like to preserve her childbearing potential.  Patient would also like to have an intrauterine contraceptive device placed today  Pertinent Gynecological History: Menses: flow is excessive with use of 3 pads or tampons on heaviest days when she is not on hormonal contraceptives Bleeding: dysfunctional uterine bleeding Contraception: none DES exposure: denies Blood transfusions: none Sexually transmitted diseases: no past history  Last pap: normal  OB History: Nulligravida   Menstrual History: Menarche age: 41 No LMP recorded.    Past Medical History:  Diagnosis Date   Asthma    Sullivan Lone syndrome    Heart murmur    benign- as a kid                    Past Surgical History:  Procedure Laterality Date   Lt femor     Repaired ligaments rt wrist               Family History  Problem Relation Age of Onset   Emphysema Paternal Grandfather    Asthma Father    Asthma Sister    No hereditary disease.  No cancer of breast, ovary, uterus. No cutaneous leiomyomatosis or renal cell carcinoma.  Social History   Socioeconomic History   Marital status: Single    Spouse name: Not on file   Number of children: Not on file   Years of education: Not on file   Highest education level: Not on file  Occupational History   Not on file  Tobacco Use   Smoking status: Never   Smokeless tobacco: Never  Vaping Use   Vaping status: Never Used  Substance and Sexual Activity   Alcohol use: Yes    Comment: Occas   Drug use: No   Sexual activity: Not Currently    Birth control/protection: Pill  Other Topics  Concern   Not on file  Social History Narrative   Not on file   Social Determinants of Health   Financial Resource Strain: Low Risk  (04/05/2023)   Overall Financial Resource Strain (CARDIA)    Difficulty of Paying Living Expenses: Not hard at all  Food Insecurity: No Food Insecurity (04/05/2023)   Hunger Vital Sign    Worried About Running Out of Food in the Last Year: Never true    Ran Out of Food in the Last Year: Never true  Transportation Needs: No Transportation Needs (04/05/2023)   PRAPARE - Administrator, Civil Service (Medical): No    Lack of Transportation (Non-Medical): No  Physical Activity: Sufficiently Active (04/05/2023)   Exercise Vital Sign    Days of Exercise per Week: 7 days    Minutes of Exercise per Session: 60 min  Stress: Stress Concern Present (04/05/2023)   [employer] of Occupational Health - Occupational Stress Questionnaire    Feeling of Stress : To some extent  Social Connections: Moderately Integrated (04/05/2023)   Social Connection and Isolation Panel [NHANES]    Frequency of Communication with Friends and Family: Three times a week    Frequency of Social Gatherings with Friends and Family: Twice a week    Attends Religious Services: More than  4 times per year    Active Member of Clubs or Organizations: Yes    Attends Banker Meetings: More than 4 times per year    Marital Status: Never married  Intimate Partner Violence: Not At Risk (04/05/2023)   Humiliation, Afraid, Rape, and Kick questionnaire    Fear of Current or Ex-Partner: No    Emotionally Abused: No    Physically Abused: No    Sexually Abused: No    Allergies  Allergen Reactions   Amoxicillin Hives   Hydrocodone Anxiety    Hallucinations     No current facility-administered medications on file prior to encounter.   Current Outpatient Medications on File Prior to Encounter  Medication Sig Dispense Refill   Multiple Vitamin (MULTIVITAMIN ADULT PO) Take  by mouth daily.     meclizine (ANTIVERT) 50 MG tablet Take 1 tablet (50 mg total) by mouth 3 (three) times daily as needed. 30 tablet 0   [DISCONTINUED] albuterol (PROVENTIL HFA;VENTOLIN HFA) 108 (90 BASE) MCG/ACT inhaler Inhale 2 puffs into the lungs every 6 (six) hours as needed for wheezing or shortness of breath. (Patient not taking: No sig reported)       Review of Systems  Constitutional: Negative.   HENT: Negative.   Eyes: Negative.   Respiratory: Negative.   Cardiovascular: Negative.   Gastrointestinal: Negative.   Genitourinary: Negative.   Musculoskeletal: Negative.   Skin: Negative.   Neurological: Negative.   Endo/Heme/Allergies: Negative.   Psychiatric/Behavioral: Negative.      Physical Exam  BP (!) 106/93   Pulse 69   Temp (!) 97.5 F (36.4 C) (Oral)   Resp 17   Ht 5\' 5"  (1.651 m)   Wt 54.7 kg   LMP  (LMP Unknown) Comment: "bleeding for last 3 months" (06/23/23);  SpO2 97%   BMI 20.07 kg/m  Constitutional: She is oriented to person, place, and time. She appears well-developed and well-nourished.  HENT:  Head: Normocephalic and atraumatic.  Nose: Nose normal.  Mouth/Throat: Oropharynx is clear and moist. No oropharyngeal exudate.  Eyes: Conjunctivae normal and EOM are normal. Pupils are equal, round, and reactive to light. No scleral icterus.  Neck: Normal range of motion. Neck supple. No tracheal deviation present. No thyromegaly present.  Cardiovascular: Normal rate.   Respiratory: Effort normal and breath sounds normal.  GI: Soft. Bowel sounds are normal. She exhibits no distension and no mass. There is no tenderness.  Lymphadenopathy:    She has no cervical adenopathy.  Neurological: She is alert and oriented to person, place, and time. She has normal reflexes.  Skin: Skin is warm.  Psychiatric: She has a normal mood and affect. Her behavior is normal. Judgment and thought content normal.   Assessment/Plan:  Endometriosis with chronic pelvic pain,  not responsive to suppression with continuous combined oral contraceptive therapy Preoperative for robot assisted laparoscopic excision of endometriosis and IUD placement (Mirena) Benefits and risks of the proposed procedures were discussed with the patient.  Bowel prep instructions were given.  All of patient's questions were answered.  She verbalized understanding.    Fermin Schwab, MD

## 2023-06-29 NOTE — Anesthesia Preprocedure Evaluation (Signed)
Anesthesia Evaluation  Patient identified by MRN, date of birth, ID band Patient awake    Reviewed: Allergy & Precautions, NPO status , Patient's Chart, lab work & pertinent test results  Airway Mallampati: II  TM Distance: >3 FB Neck ROM: Full    Dental no notable dental hx.    Pulmonary asthma    Pulmonary exam normal        Cardiovascular negative cardio ROS  Rhythm:Regular Rate:Normal     Neuro/Psych negative neurological ROS  negative psych ROS   GI/Hepatic negative GI ROS, Neg liver ROS,,,  Endo/Other  negative endocrine ROS    Renal/GU negative Renal ROS  Female GU complaint     Musculoskeletal negative musculoskeletal ROS (+)    Abdominal Normal abdominal exam  (+)   Peds  Hematology Lab Results      Component                Value               Date                      WBC                      6.1                 06/29/2023                HGB                      14.5                06/29/2023                HCT                      42.9                06/29/2023                MCV                      84.0                06/29/2023                PLT                      243                 06/29/2023              Anesthesia Other Findings   Reproductive/Obstetrics                             Anesthesia Physical Anesthesia Plan  ASA: 2  Anesthesia Plan: General   Post-op Pain Management: Toradol IV (intra-op)* and Ofirmev IV (intra-op)*   Induction: Intravenous  PONV Risk Score and Plan: 3 and Ondansetron, Dexamethasone, Midazolam and Treatment may vary due to age or medical condition  Airway Management Planned: Mask and Oral ETT  Additional Equipment: None  Intra-op Plan:   Post-operative Plan: Extubation in OR  Informed Consent: I have reviewed the patients History and Physical, chart, labs and discussed the procedure including the risks, benefits and  alternatives for the proposed anesthesia  with the patient or authorized representative who has indicated his/her understanding and acceptance.     Dental advisory given  Plan Discussed with: CRNA  Anesthesia Plan Comments:        Anesthesia Quick Evaluation

## 2023-06-29 NOTE — Anesthesia Postprocedure Evaluation (Signed)
Anesthesia Post Note  Patient: CHANTILLE NAVARRETE  Procedure(s) Performed: XI ROBOTIC ASSISTED LAPAROSCOPIC EXCISION OF ENDOMETRIOSIS;  LYSIS OF ADHESIONS; FIMBRIOPLASTY (Pelvis) INTRAUTERINE DEVICE (IUD) INSERTION AND CERVICAL BIOPSIES (Uterus) CHROMOPERTUBATION (Pelvis)     Patient location during evaluation: PACU Anesthesia Type: General Level of consciousness: awake and alert Pain management: pain level controlled Vital Signs Assessment: post-procedure vital signs reviewed and stable Respiratory status: spontaneous breathing, nonlabored ventilation, respiratory function stable and patient connected to nasal cannula oxygen Cardiovascular status: blood pressure returned to baseline and stable Postop Assessment: no apparent nausea or vomiting Anesthetic complications: no   No notable events documented.  Last Vitals:  Vitals:   06/29/23 1630 06/29/23 1645  BP: 107/68 (!) 108/57  Pulse: 84 92  Resp: 12 12  Temp:    SpO2: 100% 99%    Last Pain:  Vitals:   06/29/23 1600  TempSrc:   PainSc: Asleep                 Trevor Iha

## 2023-06-29 NOTE — Transfer of Care (Signed)
Immediate Anesthesia Transfer of Care Note  Patient: Elizabeth Nguyen  Procedure(s) Performed: Procedure(s) (LRB): XI ROBOTIC ASSISTED LAPAROSCOPIC EXCISION OF ENDOMETRIOSIS;  LYSIS OF ADHESIONS; FIMBRIOPLASTY (N/A) INTRAUTERINE DEVICE (IUD) INSERTION AND CERVICAL BIOPSIES CHROMOPERTUBATION  Patient Location: PACU  Anesthesia Type: General  Level of Consciousness: awake, oriented, sedated and patient cooperative  Airway & Oxygen Therapy: Patient Spontanous Breathing and Patient connected to face mask oxygen  Post-op Assessment: Report given to PACU RN and Post -op Vital signs reviewed and stable  Post vital signs: Reviewed and stable  Complications: No apparent anesthesia complications Last Vitals:  Vitals Value Taken Time  BP 111/61 06/29/23 1518  Temp 36.8 C 06/29/23 1518  Pulse 74 06/29/23 1527  Resp 11 06/29/23 1527  SpO2 100 % 06/29/23 1527  Vitals shown include unfiled device data.  Last Pain:  Vitals:   06/29/23 1518  TempSrc:   PainSc: Asleep      Patients Stated Pain Goal: 4 (06/29/23 1518)  Complications: No notable events documented.

## 2023-06-29 NOTE — Discharge Instructions (Signed)
   No acetaminophen/Tylenol until after 6:25 pm today if needed.  No ibuprofen, Advil, Aleve, Motrin, ketorolac, meloxicam, naproxen, or other NSAIDS until after 8:52 pm today if needed.     Post Anesthesia Home Care Instructions  Activity: Get plenty of rest for the remainder of the day. A responsible individual must stay with you for 24 hours following the procedure.  For the next 24 hours, DO NOT: -Drive a car -Advertising copywriter -Drink alcoholic beverages -Take any medication unless instructed by your physician -Make any legal decisions or sign important papers.  Meals: Start with liquid foods such as gelatin or soup. Progress to regular foods as tolerated. Avoid greasy, spicy, heavy foods. If nausea and/or vomiting occur, drink only clear liquids until the nausea and/or vomiting subsides. Call your physician if vomiting continues.  Special Instructions/Symptoms: Your throat may feel dry or sore from the anesthesia or the breathing tube placed in your throat during surgery. If this causes discomfort, gargle with warm salt water. The discomfort should disappear within 24 hours.

## 2023-07-01 ENCOUNTER — Encounter (HOSPITAL_BASED_OUTPATIENT_CLINIC_OR_DEPARTMENT_OTHER): Payer: Self-pay | Admitting: Obstetrics and Gynecology

## 2023-07-01 LAB — SURGICAL PATHOLOGY
